# Patient Record
Sex: Male | Born: 1985 | Race: Black or African American | Hispanic: No | Marital: Single | Smoking: Never smoker
Health system: Southern US, Community
[De-identification: ages and names within clinical notes are randomized; demographics above are authoritative.]

---

## 2006-11-18 ENCOUNTER — Emergency Department (HOSPITAL_COMMUNITY): Admission: EM | Admit: 2006-11-18 | Discharge: 2006-11-18 | Payer: Self-pay | Admitting: Emergency Medicine

## 2007-10-09 ENCOUNTER — Emergency Department (HOSPITAL_COMMUNITY): Admission: EM | Admit: 2007-10-09 | Discharge: 2007-10-09 | Payer: Self-pay | Admitting: Emergency Medicine

## 2007-10-13 ENCOUNTER — Emergency Department (HOSPITAL_COMMUNITY): Admission: EM | Admit: 2007-10-13 | Discharge: 2007-10-13 | Payer: Self-pay | Admitting: Emergency Medicine

## 2009-11-29 ENCOUNTER — Emergency Department (HOSPITAL_COMMUNITY): Admission: EM | Admit: 2009-11-29 | Discharge: 2009-11-29 | Payer: Self-pay | Admitting: Emergency Medicine

## 2010-04-11 LAB — COMPREHENSIVE METABOLIC PANEL
CO2: 27 mEq/L (ref 19–32)
Calcium: 9.7 mg/dL (ref 8.4–10.5)
Creatinine, Ser: 1.14 mg/dL (ref 0.4–1.5)
GFR calc Af Amer: >60 mL/min (ref >60)
GFR calc non Af Amer: >60 mL/min (ref >60)
Glucose, Bld: 124 mg/dL — ABNORMAL HIGH (ref 70–99)

## 2010-04-11 LAB — URINALYSIS, ROUTINE W REFLEX MICROSCOPIC
Bilirubin Urine: NEGATIVE
Ketones, ur: >80 mg/dL — AB
Nitrite: NEGATIVE
pH: 6 (ref 5.0–8.0)

## 2010-04-11 LAB — DIFFERENTIAL
Lymphocytes Relative: 4 % — ABNORMAL LOW (ref 12–46)
Lymphs Abs: 0.6 10*3/uL — ABNORMAL LOW (ref 0.7–4.0)
Neutro Abs: 12.8 10*3/uL — ABNORMAL HIGH (ref 1.7–7.7)
Neutrophils Relative %: 91 % — ABNORMAL HIGH (ref 43–77)

## 2010-04-11 LAB — CBC
HCT: 48.2 % (ref 39.0–52.0)
Hemoglobin: 17.3 g/dL — ABNORMAL HIGH (ref 13.0–17.0)
MCH: 30.5 pg (ref 26.0–34.0)
MCHC: 35.9 g/dL (ref 30.0–36.0)

## 2010-05-03 ENCOUNTER — Inpatient Hospital Stay (INDEPENDENT_AMBULATORY_CARE_PROVIDER_SITE_OTHER)
Admission: RE | Admit: 2010-05-03 | Discharge: 2010-05-03 | Disposition: A | Discharge status: Home / Self Care | Payer: Self-pay | Source: Ambulatory Visit | Attending: Family Medicine | Admitting: Family Medicine

## 2010-05-03 DIAGNOSIS — R059 Cough, unspecified: Secondary | ICD-10-CM

## 2010-05-03 DIAGNOSIS — R05 Cough: Secondary | ICD-10-CM

## 2011-12-11 ENCOUNTER — Ambulatory Visit (INDEPENDENT_AMBULATORY_CARE_PROVIDER_SITE_OTHER): Payer: 59 | Admitting: Family Medicine

## 2011-12-11 VITALS — BP 128/80 | HR 58 | Temp 98.2°F | Resp 16 | Ht 67.5 in | Wt 135.8 lb

## 2011-12-11 DIAGNOSIS — S239XXA Sprain of unspecified parts of thorax, initial encounter: Secondary | ICD-10-CM

## 2011-12-11 MED ORDER — CYCLOBENZAPRINE HCL 10 MG PO TABS: 10.0000 mg | ORAL_TABLET | Freq: Every evening | ORAL | Status: DC | PRN

## 2011-12-11 MED ORDER — PREDNISONE 20 MG PO TABS: 40.0000 mg | ORAL_TABLET | Freq: Every day | ORAL | Status: DC

## 2011-12-11 NOTE — Patient Instructions (Addendum)
Thoracic Strain  You have injured the muscles or tendons that attach to the upper part of your back behind your chest. This injury is called a thoracic strain, thoracic sprain, or mid-back strain.   CAUSES   The cause of thoracic strain varies. A less severe injury involves pulling a muscle or tendon without tearing it. A more severe injury involves tearing (rupturing) a muscle or tendon. With less severe injuries, there may be little loss of strength. Sometimes, there are breaks (fractures) in the bones to which the muscles are attached. These fractures are rare, unless there was a direct hit (trauma) or you have weak bones due to osteoporosis or age. Longstanding strains may be caused by overuse or improper form during certain movements. Obesity can also increase your risk for back injuries. Sudden strains may occur due to injury or not warming up properly before exercise. Often, there is no obvious cause for a thoracic strain.  SYMPTOMS   The main symptom is pain, especially with movement, such as during exercise.  DIAGNOSIS   Your caregiver can usually tell what is wrong by taking an X-ray and doing a physical exam.  TREATMENT    Physical therapy may be helpful for recovery. Your caregiver can give you exercises to do or refer you to a physical therapist after your pain improves.   After your pain improves, strengthening and conditioning programs appropriate for your sport or occupation may be helpful.   Always warm up before physical activities or athletics. Stretching after physical activity may also help.   Certain over-the-counter medicines may also help. Ask your caregiver if there are medicines that would help you.  If this is your first thoracic strain injury, proper care and proper healing time before starting activities should prevent long-term problems. Torn ligaments and tendons require as long to heal as broken bones. Average healing times may be only 1 week for a mild strain. For torn muscles  and tendons, healing time may be up to 6 weeks to 2 months.  HOME CARE INSTRUCTIONS    Apply ice to the injured area. Ice massages may also be used as directed.   Put ice in a plastic bag.   Place a towel between your skin and the bag.   Leave the ice on for 15 to 20 minutes, 3 to 4 times a day, for the first 2 days.   Only take over-the-counter or prescription medicines for pain, discomfort, or fever as directed by your caregiver.   Keep your appointments for physical therapy if this was prescribed.   Use wraps and back braces as instructed.  SEEK IMMEDIATE MEDICAL CARE IF:    You have an increase in bruising, swelling, or pain.   Your pain has not improved with medicines.   You develop new shortness of breath, chest pain, or fever.   Problems seem to be getting worse rather than better.  MAKE SURE YOU:    Understand these instructions.   Will watch your condition.   Will get help right away if you are not doing well or get worse.  Document Released: 04/07/2003 Document Revised: 04/09/2011 Document Reviewed: 03/03/2010  ExitCare Patient Information 2013 [employer].

## 2011-12-11 NOTE — Progress Notes (Signed)
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 He says he's had this for 2 weeks during which she was in a bowling league. He's no longer bowling actively.  He's had no shortness of breath, anterior chest pain, cough, hemoptysis. He's also had no history of an acute injury.  He says the pain gets better when he moves around the bed his back tightens up when he stops moving.  Objective: No acute distress Chest is clear he is mildly tender in the rhomboid area of his right upper thoracic chest. He has full range of motion of the spine and is not particularly tender with palpation over the spinous processes. The overlying skin shows no swelling, ecchymosis, or rash. His right shoulder range of motion is normal.  Assessment: Muscle strain, right rhomboids, staying the same (not  worsening)   Plan: 1. Thoracic back sprain  cyclobenzaprine (FLEXERIL) 10 MG tablet, predniSONE (DELTASONE) 20 MG tablet   Call if not completely resolved by the end of the week or return.

## 2012-10-17 ENCOUNTER — Ambulatory Visit: Payer: 59

## 2012-10-17 ENCOUNTER — Ambulatory Visit (INDEPENDENT_AMBULATORY_CARE_PROVIDER_SITE_OTHER): Payer: 59 | Admitting: Family Medicine

## 2012-10-17 VITALS — BP 104/76 | HR 64 | Temp 98.5°F | Resp 18 | Ht 66.5 in | Wt 127.8 lb

## 2012-10-17 DIAGNOSIS — M79642 Pain in left hand: Secondary | ICD-10-CM

## 2012-10-17 DIAGNOSIS — S62102A Fracture of unspecified carpal bone, left wrist, initial encounter for closed fracture: Secondary | ICD-10-CM

## 2012-10-17 DIAGNOSIS — M79609 Pain in unspecified limb: Secondary | ICD-10-CM

## 2012-10-17 DIAGNOSIS — S62109A Fracture of unspecified carpal bone, unspecified wrist, initial encounter for closed fracture: Secondary | ICD-10-CM

## 2012-10-17 NOTE — Progress Notes (Signed)
[facility] [address], [place] [address] [phone]  Date:  10/17/2012   Name:  Dustin Saunders   DOB:  [birth year]   MRN:  [id number]  PCP:  No primary provider on file.    Chief Complaint: Hand Injury   History of Present Illness:  Dustin Saunders is a 28 y.o. very pleasant male patient who presents with the following:  Last night he was playing basketball- he fell onto his left hand, FOOSH mechanism.  He was able to continue to play, but about an hour later the hand started to hurt.   It is still sore today so he decided to come in for an evaluation  Otherwise he is unhurt, he is generally healthy.   No prior history of injury to his left hand, he is right handed    There are no active problems to display for this patient.   History reviewed. No pertinent past medical history.  History reviewed. No pertinent past surgical history.  History  Substance Use Topics  . Smoking status: Never Smoker   . Smokeless tobacco: Not on file  . Alcohol Use: No    Family History  Problem Relation Age of Onset  . Hypertension Mother   . Cancer Father     No Known Allergies  Medication list has been reviewed and updated.  Current Outpatient Prescriptions on File Prior to Visit  Medication Sig Dispense Refill  . cyclobenzaprine (FLEXERIL) 10 MG tablet Take 1 tablet (10 mg total) by mouth at bedtime as needed for muscle spasms.  15 tablet  0  . predniSONE (DELTASONE) 20 MG tablet Take 2 tablets (40 mg total) by mouth daily.  10 tablet  0   No current facility-administered medications on file prior to visit.    Review of Systems:  As per HPI- otherwise negative.   Physical Examination: Filed Vitals:   10/17/12 1154  BP: 104/76  Pulse: 64  Temp: 98.5 F (36.9 C)  Resp: 18   Filed Vitals:   10/17/12 1154  Height: 5' 6.5" (1.689 m)  Weight: 127 lb 12.8 oz (57.97 kg)   Body mass index is 20.32 kg/(m^2). Ideal Body Weight: Weight in  (lb) to have BMI = 25: 156.9  GEN: WDWN, NAD, Non-toxic, A & O x 3, looks well HEENT: Atraumatic, Normocephalic. Neck supple. No masses, No LAD. Ears and Nose: No external deformity. CV: RRR, No M/G/R. No JVD. No thrill. No extra heart sounds. PULM: CTA B, no wheezes, crackles, rhonchi. No retractions. No resp. distress. No accessory muscle use. EXTR: No c/c/e NEURO Normal gait.  PSYCH: Normally interactive. Conversant. Not depressed or anxious appearing.  Calm demeanor.  Left hand: he is somewhat tender over the proximal 4th and 5th MC and over the wrist. No pinpoint tenderness.  He has pain with wrist flexion and extension No swelling, bruising, heat or wound.   Elbow negative.  Fingers NV intact, normal pulse at wrist, normal cap refill and sensation of fingertips.    [facility] reading (PRIMARY) by  Dr. Patsy Lager. Left hand: small avulsion fracture on the dorsal wrist, from one of the carpal bones  LEFT HAND - COMPLETE 3+ VIEW  Comparison: None.  Findings:  Seen only on the lateral radiograph is a minimally displaced triquetral fracture. Expected adjacent soft tissue swelling. No additional fractures identified. No dislocation. Joint spaces are preserved. No radiopaque foreign body.  IMPRESSION: Minimally displaced triquetral fracture.  Clinically significant discrepancy from primary report,  if provided: None   Placed in volar splint  Assessment and Plan: Pain of left hand - Plan: DG Hand Complete Left  Closed fracture of carpal bone, left, initial encounter  Fracture of carpal bone.  Placed in a volar splint and instructed to keep it on 24/ 7.  Fingers are left mobile.  He will see [facility] next week- called pt and gave him these details.  He will let us know if any problems in the meantime   Signed Abbe Amsterdam, MD

## 2012-10-17 NOTE — Patient Instructions (Addendum)
I will be in touch with your x-ray over- read report.  We will plan to get you in to see orthopedics next week.  In the meantime wear your splint all the time.  Apply ice, and take ibuprofen as needed

## 2013-08-04 ENCOUNTER — Encounter: Payer: Self-pay | Admitting: Emergency Medicine

## 2013-08-04 ENCOUNTER — Ambulatory Visit (INDEPENDENT_AMBULATORY_CARE_PROVIDER_SITE_OTHER): Payer: 59 | Admitting: Emergency Medicine

## 2013-08-04 ENCOUNTER — Ambulatory Visit (INDEPENDENT_AMBULATORY_CARE_PROVIDER_SITE_OTHER): Payer: 59

## 2013-08-04 VITALS — BP 110/76 | HR 107 | Temp 98.1°F | Resp 16 | Ht 67.0 in | Wt 131.6 lb

## 2013-08-04 DIAGNOSIS — R1115 Cyclical vomiting syndrome unrelated to migraine: Secondary | ICD-10-CM

## 2013-08-04 DIAGNOSIS — R0602 Shortness of breath: Secondary | ICD-10-CM

## 2013-08-04 DIAGNOSIS — F411 Generalized anxiety disorder: Secondary | ICD-10-CM

## 2013-08-04 MED ORDER — ONDANSETRON 8 MG PO TBDP: 8.0000 mg | ORAL_TABLET | Freq: Three times a day (TID) | ORAL | Status: DC | PRN

## 2013-08-04 MED ORDER — LOPERAMIDE HCL 2 MG PO TABS: 2.0000 mg | ORAL_TABLET | Freq: Four times a day (QID) | ORAL | Status: DC | PRN

## 2013-08-04 NOTE — Patient Instructions (Signed)
Clear Liquid Diet A clear liquid diet is a short-term diet that is prescribed to provide the necessary fluid and basic energy you need when you can have nothing else. The clear liquid diet consists of liquids or solids that will become liquid at room temperature. You should be able to see through the liquid. There are many reasons that you may be restricted to clear liquids, such as:  When you have a sudden-onset (acute) condition that occurs before or after surgery.  To help your body slowly get adjusted to food again after a long period when you were unable to have food.  Replacement of fluids when you have a diarrheal disease.  When you are going to have certain exams, such as a colonoscopy, in which instruments are inserted inside your body to look at parts of your digestive system. WHAT CAN I HAVE? A clear liquid diet does not provide all the nutrients you need. It is important to choose a variety of the following items to get as many nutrients as possible:  Vegetable juices that do not have pulp.  Fruit juices and fruit drinks that do not have pulp.  Coffee (regular or decaffeinated), tea, or soda at the discretion of your health care provider.  Clear bouillon, broth, or strained broth-based soups.  High-protein and flavored gelatins.  Sugar or honey.  Ices or frozen ice pops that do not contain milk. If you are not sure whether you can have certain items, you should ask your health care provider. You may also ask your health care provider if there are any other clear liquid options. Document Released: 01/15/2005 Document Revised: 01/20/2013 Document Reviewed: 12/12/2012 ExitCare Patient Information 2015 Exit[employer]. This information is not intended to replace advice given to you by your health care provider. Make sure you discuss any questions you have with your health care provider.  

## 2013-08-04 NOTE — Progress Notes (Signed)
[facility] [address], [place] [id number] [phone]  Date:  08/04/2013   Name:  Dustin AllanJoshua W Marczak   DOB:  08/25/1985   MRN:  [id number]  PCP:  No PCP Per Patient    Chief Complaint: No chief complaint on file.   History of Present Illness:  Dustin Saunders is a 28 y.o. very pleasant male patient who presents with the following:  I reviewed the CC, ROS FH, PMH and SH and nurses note that were not available when I opened the chart in the EMR  Patient developed diarrhea this morning around 0600.  He has had several watery stools.  The patient has no complaint of blood, mucous, or pus in her stools. No fever or chills.  Was nauseated following and now has a sensation of shortness of breath.  He has no cough or wheezing.  No sputum production. No orthopnea or PND.  Drank an energy drink yesterday and again today.  Never had similar prior to yesterday.  Has no antecedent illness or injury. No limitation of activity.  No immobilization or surgery or prolonged travel.    There are no active problems to display for this patient.   No past medical history on file.  No past surgical history on file.  History  Substance Use Topics  . Smoking status: Never Smoker   . Smokeless tobacco: Not on file  . Alcohol Use: No    Family History  Problem Relation Age of Onset  . Hypertension Mother   . Cancer Father     No Known Allergies  Medication list has been reviewed and updated.  No current outpatient prescriptions on file prior to visit.   No current facility-administered medications on file prior to visit.    Review of Systems:  As per HPI, otherwise negative.    Physical Examination: There were no vitals filed for this visit. There were no vitals filed for this visit. There is no weight on file to calculate BMI. Ideal Body Weight:    GEN: WDWN, NAD, Non-toxic, A & O x 3  Speaks in full sentences.  Not cyanotic or tacypneic  HEENT:  Atraumatic, Normocephalic. Neck supple. No masses, No LAD. Ears and Nose: No external deformity. CV: RRR, No M/G/R. No JVD. No thrill. No extra heart sounds. PULM: CTA B, no wheezes, crackles, rhonchi. No retractions. No resp. distress. No accessory muscle use. ABD: S, NT, ND, +BS. No rebound. No HSM. EXTR: No c/c/e NEURO Normal gait.  PSYCH: Normally interactive. Conversant. Not depressed or anxious appearing.  Calm demeanor.    Assessment and Plan: Nausea and vomiting Anxiety reaction Shortness of breath Stop energy drinks Imodium zofran  Signed,  Phillips OdorJeffery Anderson, MD   [facility] reading (PRIMARY) by  Dr. Dareen PianoAnderson.  Negative chest .

## 2014-06-30 ENCOUNTER — Ambulatory Visit (INDEPENDENT_AMBULATORY_CARE_PROVIDER_SITE_OTHER): Payer: 59 | Admitting: Urgent Care

## 2014-06-30 VITALS — BP 124/78 | HR 70 | Temp 98.8°F | Resp 17 | Ht 68.0 in | Wt 134.6 lb

## 2014-06-30 DIAGNOSIS — J029 Acute pharyngitis, unspecified: Secondary | ICD-10-CM

## 2014-06-30 DIAGNOSIS — J040 Acute laryngitis: Secondary | ICD-10-CM

## 2014-06-30 DIAGNOSIS — R490 Dysphonia: Secondary | ICD-10-CM

## 2014-06-30 LAB — POCT RAPID STREP A (OFFICE): Rapid Strep A Screen: NEGATIVE

## 2014-06-30 MED ORDER — HYDROCODONE-HOMATROPINE 5-1.5 MG/5ML PO SYRP: 5.0000 mL | ORAL_SOLUTION | Freq: Every evening | ORAL | Status: DC | PRN

## 2014-06-30 NOTE — Progress Notes (Signed)
    MRN: [id number] DOB: 05/11/1985  Subjective:   Dustin Saunders is a 29 y.o. male presenting for chief complaint of Hoarse and Sore Throat  Reports one day history of sore throat and voice hoarseness, also has left sided lymph node pain, intermittent dry cough. Has tried Tylenol for relief but this has not helped. Denies fevers, sinus headache, sinus congestion, sinus pain, ear pain, ear drainage, tooth pain, chest pain, chest tightness, shortness of breath. Of note, patient has a 30-year-old daughter that started with similar symptoms about 3 days ago, girlfriend also had symptoms earlier in the week. Denies any other aggravating or relieving factors, no other questions or concerns.  Dustin Saunders currently has no medications in their medication list. He has No Known Allergies.  Dustin Saunders  has no past medical history on file. Also  has no past surgical history on file.  ROS As in subjective.  Objective:   Vitals: BP 124/78 mmHg  Pulse 70  Temp(Src) 98.8 F (37.1 C) (Oral)  Resp 17  Ht 5\' 8"  (1.727 m)  Wt 134 lb 9.6 oz (61.054 kg)  BMI 20.47 kg/m2  SpO2 96%  Physical Exam  Constitutional: He appears well-developed and well-nourished.  HENT:  TM's intact bilaterally, no effusions or erythema. Nasal turbinates slightly boggy. No sinus tenderness. Postnasal drip present with slight erythema but without oropharyngeal exudates or abscesses.  Eyes: Conjunctivae and EOM are normal. Right eye exhibits no discharge. Left eye exhibits no discharge. No scleral icterus.  Neck: Normal range of motion.  Cardiovascular: Normal rate, regular rhythm and intact distal pulses.  Exam reveals no gallop and no friction rub.   No murmur heard. Pulmonary/Chest: No stridor. No respiratory distress. He has no wheezes. He has no rales. He exhibits no tenderness.  Lymphadenopathy:    He has cervical adenopathy (left sided).  Skin: Skin is warm and dry. No rash noted. No erythema. No pallor.   Results for  orders placed or performed in visit on 06/30/14 (from the past 24 hour(s))  POCT rapid strep A     Status: Normal   Collection Time: 06/30/14  3:50 PM  Result Value Ref Range   Rapid Strep A Screen Negative Negative   Assessment and Plan :   1. Sore throat 2. Hoarseness of voice 3. Laryngitis 4. Pharyngitis - Likely undergoing viral syndrome, culture pending, advise Hycodan at night for cough and sore throat, Delsym during the day, may supplement this with ibuprofen - Return to clinic in one week if symptoms persist and have not improved  Wallis BambergMario Niv Darley, PA-C [facility] [phone] 06/30/2014 3:29 PM

## 2014-06-30 NOTE — Patient Instructions (Signed)
Laryngitis At the top of your windpipe is your voice box. It is the source of your voice. Inside your voice box are 2 bands of muscles called vocal cords. When you breathe, your vocal cords are relaxed and open so that air can get into the lungs. When you decide to say something, these cords come together and vibrate. The sound from these vibrations goes into your throat and comes out through your mouth as sound. Laryngitis is an inflammation of the vocal cords that causes hoarseness, cough, loss of voice, sore throat, and dry throat. Laryngitis can be temporary (acute) or long-term (chronic). Most cases of acute laryngitis improve with time.Chronic laryngitis lasts for more than 3 weeks. CAUSES Laryngitis can often be related to excessive smoking, talking, or yelling, as well as inhalation of toxic fumes and allergies. Acute laryngitis is usually caused by a viral infection, vocal strain, measles or mumps, or bacterial infections. Chronic laryngitis is usually caused by vocal cord strain, vocal cord injury, postnasal drip, growths on the vocal cords, or acid reflux. SYMPTOMS   Cough.  Sore throat.  Dry throat. RISK FACTORS  Respiratory infections.  Exposure to irritating substances, such as cigarette smoke, excessive amounts of alcohol, stomach acids, and workplace chemicals.  Voice trauma, such as vocal cord injury from shouting or speaking too loud. DIAGNOSIS  Your cargiver will perform a physical exam. During the physical exam, your caregiver will examine your throat. The most common sign of laryngitis is hoarseness. Laryngoscopy may be necessary to confirm the diagnosis of this condition. This procedure allows your caregiver to look into the larynx. HOME CARE INSTRUCTIONS  Drink enough fluids to keep your urine clear or pale yellow.  Rest until you no longer have symptoms or as directed by your caregiver.  Breathe in moist air.  Take all medicine as directed by your  caregiver.  Do not smoke.  Talk as little as possible (this includes whispering).  Write on paper instead of talking until your voice is back to normal.  Follow up with your caregiver if your condition has not improved after 10 days. SEEK MEDICAL CARE IF:   You have trouble breathing.  You cough up blood.  You have persistent fever.  You have increasing pain.  You have difficulty swallowing. MAKE SURE YOU:  Understand these instructions.  Will watch your condition.  Will get help right away if you are not doing well or get worse. Document Released: 01/15/2005 Document Revised: 04/09/2011 Document Reviewed: 03/23/2010 [facility] Patient Information 2015 [place]. This information is not intended to replace advice given to you by your health care provider. Make sure you discuss any questions you have with your health care provider.    Pharyngitis Pharyngitis is redness, pain, and swelling (inflammation) of your pharynx.  CAUSES  Pharyngitis is usually caused by infection. Most of the time, these infections are from viruses (viral) and are part of a cold. However, sometimes pharyngitis is caused by bacteria (bacterial). Pharyngitis can also be caused by allergies. Viral pharyngitis may be spread from person to person by coughing, sneezing, and personal items or utensils (cups, forks, spoons, toothbrushes). Bacterial pharyngitis may be spread from person to person by more intimate contact, such as kissing.  SIGNS AND SYMPTOMS  Symptoms of pharyngitis include:   Sore throat.   Tiredness (fatigue).   Low-grade fever.   Headache.  Joint pain and muscle aches.  Skin rashes.  Swollen lymph nodes.  Plaque-like film on throat or tonsils (often seen with  DIAGNOSIS  °Your health care provider will ask you questions about your illness and your symptoms. Your medical history, along with a physical exam, is often all that is needed to diagnose  pharyngitis. Sometimes, a rapid strep test is done. Other lab tests may also be done, depending on the suspected cause.  °TREATMENT  °Viral pharyngitis will usually get better in 3-4 days without the use of medicine. Bacterial pharyngitis is treated with medicines that kill germs (antibiotics).  °HOME CARE INSTRUCTIONS  °· Drink enough water and fluids to keep your urine clear or pale yellow.   °· Only take over-the-counter or prescription medicines as directed by your health care provider:   °¨ If you are prescribed antibiotics, make sure you finish them even if you start to feel better.   °¨ Do not take aspirin.   °· Get lots of rest.   °· Gargle with 8 oz of salt water (½ tsp of salt per 1 qt of water) as often as every 1-2 hours to soothe your throat.   °· Throat lozenges (if you are not at risk for choking) or sprays may be used to soothe your throat. °SEEK MEDICAL CARE IF:  °· You have large, tender lumps in your neck. °· You have a rash. °· You cough up green, yellow-brown, or bloody spit. °SEEK IMMEDIATE MEDICAL CARE IF:  °· Your neck becomes stiff. °· You drool or are unable to swallow liquids. °· You vomit or are unable to keep medicines or liquids down. °· You have severe pain that does not go away with the use of recommended medicines. °· You have trouble breathing (not caused by a stuffy nose). °MAKE SURE YOU:  °· Understand these instructions. °· Will watch your condition. °· Will get help right away if you are not doing well or get worse. °Document Released: 01/15/2005 Document Revised: 11/05/2012 Document Reviewed: 09/22/2012 °ExitCare® Patient Information ©2015 [employer], LLC. This information is not intended to replace advice given to you by your health care provider. Make sure you discuss any questions you have with your health care provider. ° °

## 2014-07-02 LAB — CULTURE, GROUP A STREP: Organism ID, Bacteria: NORMAL

## 2014-07-09 ENCOUNTER — Telehealth: Payer: Self-pay | Admitting: Urgent Care

## 2014-07-09 NOTE — Telephone Encounter (Signed)
-----   Message from Johnnette Litter, [place] sent at 07/07/2014  9:29 AM EDT ----- Spoke with pt. Notified of labs. Pt states he is overall feeling ok, but still has a bad cough. Cough syrup helps somewhat at night, but wants to know if he can get something to take during the day. Thanks

## 2014-07-09 NOTE — Telephone Encounter (Signed)
Called patient to follow up and left voice message. I apologized for the delay in getting back to him. I also offered antibiotic course if he is still symptomatic/experiencing laryngitis. Please let me know what he would like to do. Thank you!

## 2015-06-30 IMAGING — CR DG CHEST 2V
2 series · 2 of 2 positions shown · non-contrast
Comparison: None.

CLINICAL DATA: Difficulty breathing

EXAM:
CHEST  2 VIEW

[PA]
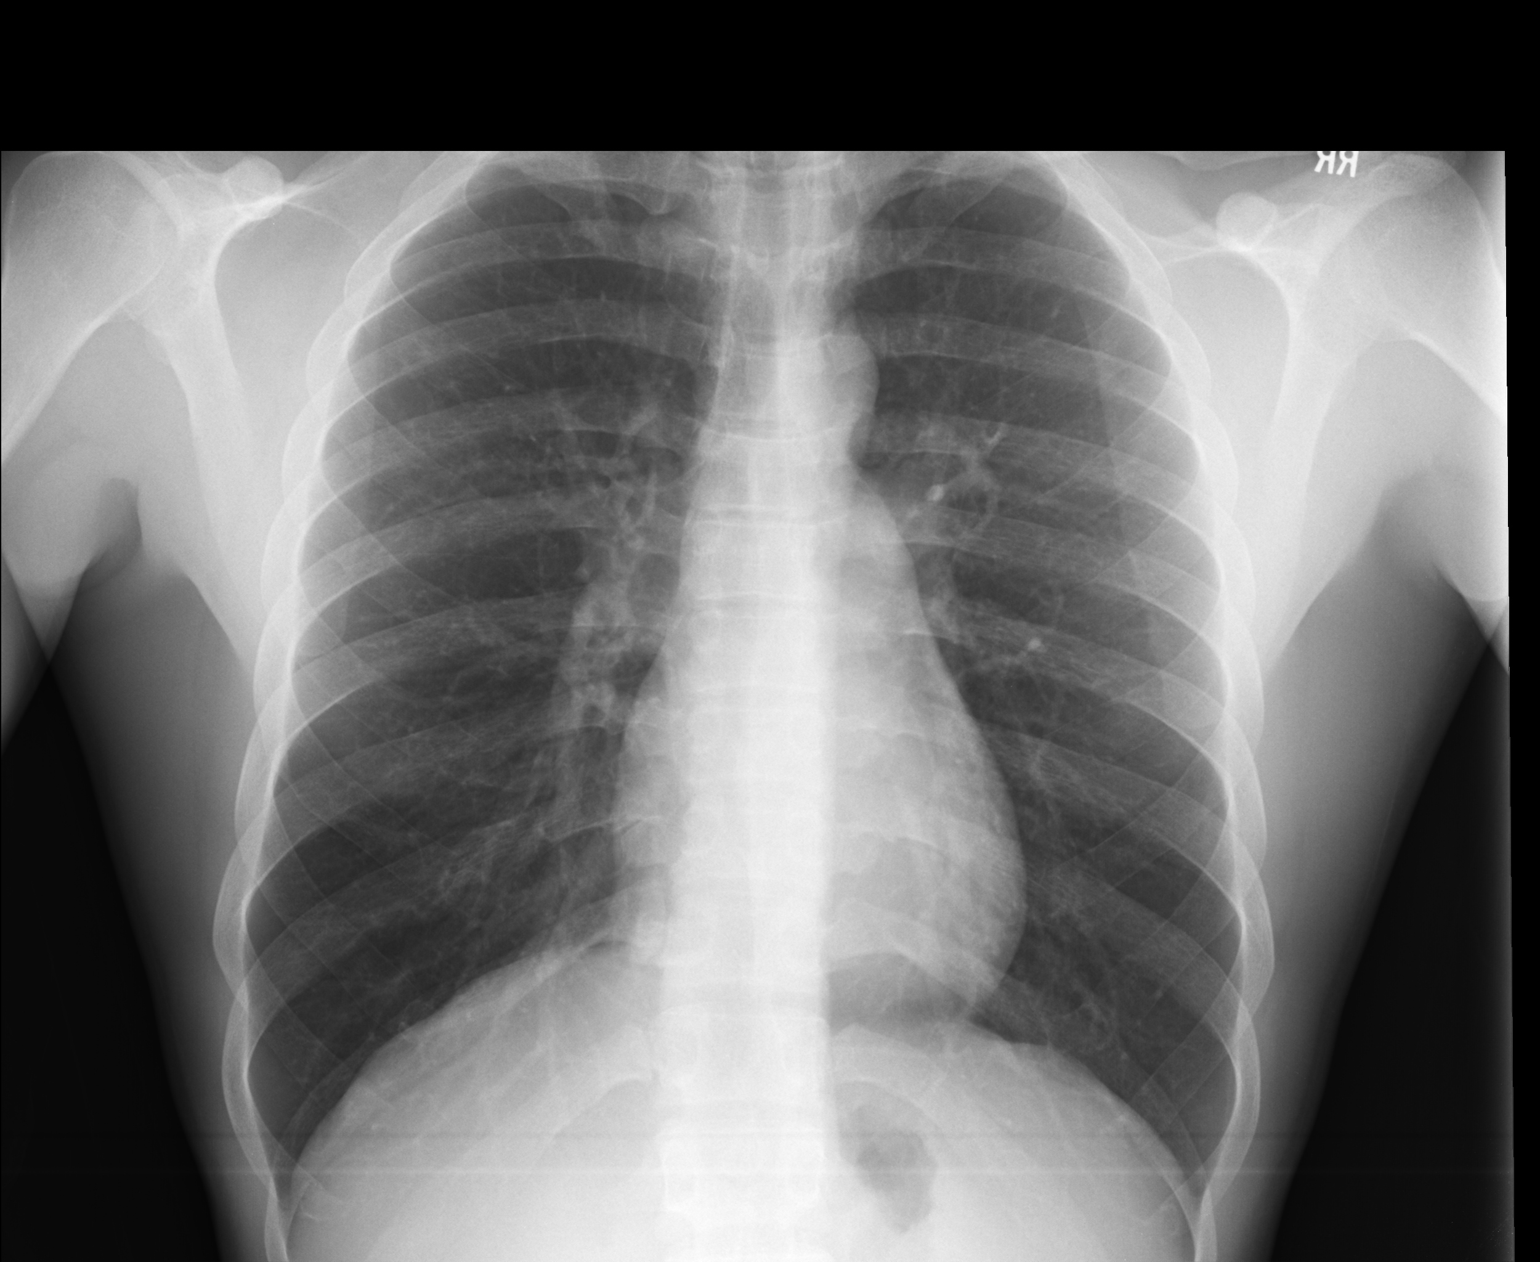

[lateral]
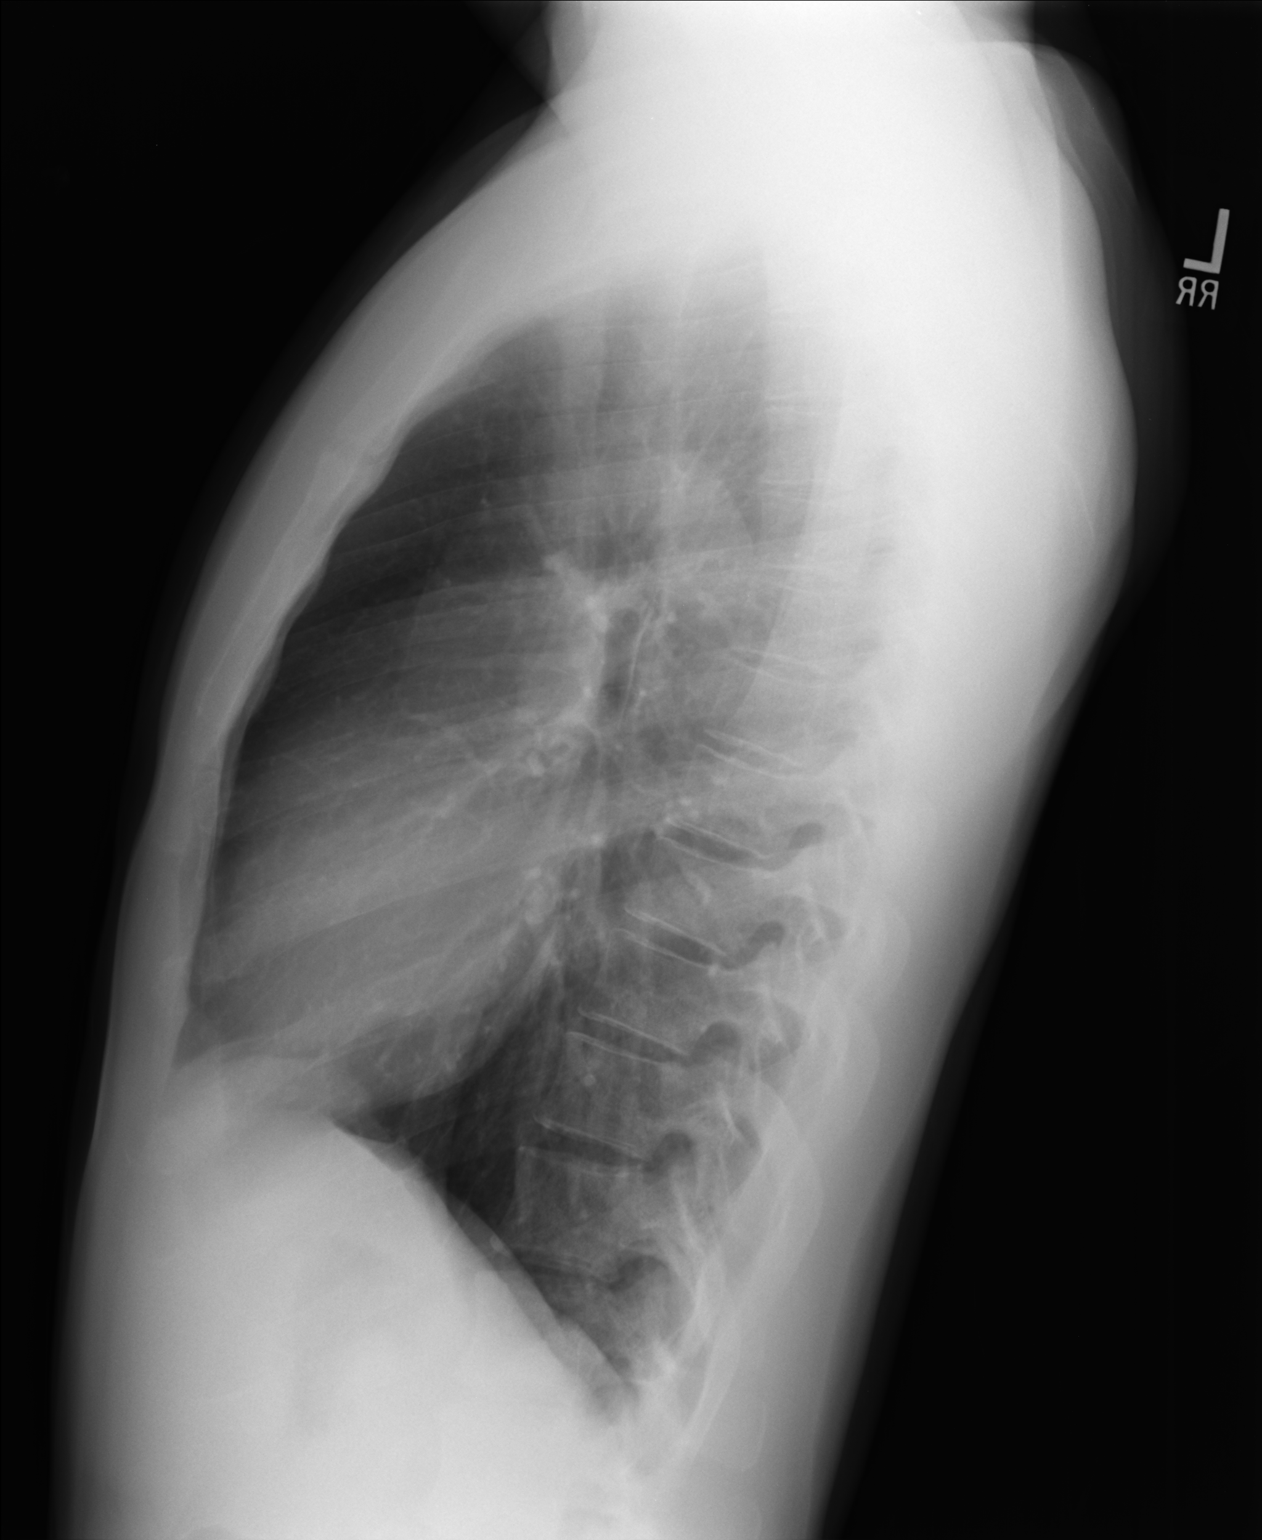

[2 of 2 positions shown; findings below may reference images not displayed]

FINDINGS: Lungs are clear. Heart size and pulmonary vascularity are normal. No
adenopathy. No bone lesions. There is thoracic lordosis.
IMPRESSION: No edema or consolidation.

## 2015-07-29 ENCOUNTER — Ambulatory Visit (INDEPENDENT_AMBULATORY_CARE_PROVIDER_SITE_OTHER): Payer: 59 | Admitting: Emergency Medicine

## 2015-07-29 VITALS — BP 116/80 | HR 65 | Temp 98.3°F | Resp 18 | Ht 68.0 in | Wt 138.0 lb

## 2015-07-29 DIAGNOSIS — Z131 Encounter for screening for diabetes mellitus: Secondary | ICD-10-CM | POA: Diagnosis not present

## 2015-07-29 DIAGNOSIS — R202 Paresthesia of skin: Secondary | ICD-10-CM | POA: Diagnosis not present

## 2015-07-29 DIAGNOSIS — R200 Anesthesia of skin: Secondary | ICD-10-CM

## 2015-07-29 DIAGNOSIS — Z0000 Encounter for general adult medical examination without abnormal findings: Secondary | ICD-10-CM | POA: Diagnosis not present

## 2015-07-29 DIAGNOSIS — Z13220 Encounter for screening for lipoid disorders: Secondary | ICD-10-CM

## 2015-07-29 DIAGNOSIS — R208 Other disturbances of skin sensation: Secondary | ICD-10-CM

## 2015-07-29 LAB — POCT CBC
GRANULOCYTE PERCENT: 62.9 % (ref 37–80)
HEMATOCRIT: 45.6 % (ref 43.5–53.7)
HEMOGLOBIN: 16.4 g/dL (ref 14.1–18.1)
Lymph, poc: 2.6 (ref 0.6–3.4)
MCH, POC: 30.1 pg (ref 27–31.2)
MCHC: 35.9 g/dL — AB (ref 31.8–35.4)
MCV: 83.9 fL (ref 80–97)
MID (cbc): 0.3 (ref 0–0.9)
MPV: 7.2 fL (ref 0–99.8)
POC GRANULOCYTE: 4.8 (ref 2–6.9)
POC LYMPH PERCENT: 33.2 %L (ref 10–50)
POC MID %: 3.9 %M (ref 0–12)
Platelet Count, POC: 227 10*3/uL (ref 142–424)
RBC: 5.43 M/uL (ref 4.69–6.13)
RDW, POC: 13.2 %
WBC: 7.7 10*3/uL (ref 4.6–10.2)

## 2015-07-29 LAB — POCT URINALYSIS DIP (MANUAL ENTRY)
BILIRUBIN UA: NEGATIVE
GLUCOSE UA: NEGATIVE
Ketones, POC UA: NEGATIVE
Leukocytes, UA: NEGATIVE
NITRITE UA: NEGATIVE
Protein Ur, POC: NEGATIVE
RBC UA: NEGATIVE
Spec Grav, UA: 1.02
Urobilinogen, UA: 0.2
pH, UA: 7.5

## 2015-07-29 LAB — COMPLETE METABOLIC PANEL WITH GFR
ALT: 10 U/L (ref 9–46)
AST: 13 U/L (ref 10–40)
Albumin: 4.8 g/dL (ref 3.6–5.1)
Alkaline Phosphatase: 40 U/L (ref 40–115)
BUN: 15 mg/dL (ref 7–25)
CALCIUM: 10 mg/dL (ref 8.6–10.3)
CHLORIDE: 100 mmol/L (ref 98–110)
CO2: 28 mmol/L (ref 20–31)
Creat: 1.08 mg/dL (ref 0.60–1.35)
GFR, Est Non African American: >89 mL/min (ref >=60)
Glucose, Bld: 81 mg/dL (ref 65–99)
POTASSIUM: 4.1 mmol/L (ref 3.5–5.3)
Sodium: 138 mmol/L (ref 135–146)
Total Bilirubin: 0.7 mg/dL (ref 0.2–1.2)
Total Protein: 8.1 g/dL (ref 6.1–8.1)

## 2015-07-29 LAB — LIPID PANEL
CHOL/HDL RATIO: 2.3 ratio (ref <=5.0)
CHOLESTEROL: 190 mg/dL (ref 125–200)
HDL: 81 mg/dL (ref >=40)
LDL Cholesterol: 88 mg/dL (ref <130)
TRIGLYCERIDES: 107 mg/dL (ref <150)
VLDL: 21 mg/dL (ref <30)

## 2015-07-29 LAB — POCT GLYCOSYLATED HEMOGLOBIN (HGB A1C): Hemoglobin A1C: 5.2

## 2015-07-29 LAB — TSH: TSH: 1.23 mIU/L (ref 0.40–4.50)

## 2015-07-29 LAB — GLUCOSE, POCT (MANUAL RESULT ENTRY): POC GLUCOSE: 81 mg/dL (ref 70–99)

## 2015-07-29 NOTE — Patient Instructions (Addendum)
   IF you received an x-ray today, you will receive an invoice from [facility]. Please contact  Radiology at [phone] with questions or concerns regarding your invoice.   IF you received labwork today, you will receive an invoice from [facility]. Please contact [facility] at [phone] with questions or concerns regarding your invoice.   Our billing staff will not be able to assist you with questions regarding bills from these companies.  You will be contacted with the lab results as soon as they are available. The fastest way to get your results is to activate your My Chart account. Instructions are located on the last page of this paperwork. If you have not heard from us regarding the results in 2 weeks, please contact this office.    Health Maintenance, Male A healthy lifestyle and preventative care can promote health and wellness.  Maintain regular health, dental, and eye exams.  Eat a healthy diet. Foods like vegetables, fruits, whole grains, low-fat dairy products, and lean protein foods contain the nutrients you need and are low in calories. Decrease your intake of foods high in solid fats, added sugars, and salt. Get information about a proper diet from your health care provider, if necessary.  Regular physical exercise is one of the most important things you can do for your health. Most adults should get at least 150 minutes of moderate-intensity exercise (any activity that increases your heart rate and causes you to sweat) each week. In addition, most adults need muscle-strengthening exercises on 2 or more days a week.   Maintain a healthy weight. The body mass index (BMI) is a screening tool to identify possible weight problems. It provides an estimate of body fat based on height and weight. Your health care provider can find your BMI and can help you achieve or maintain a healthy weight. For males 20 years and older:  A BMI  below 18.5 is considered underweight.  A BMI of 18.5 to 24.9 is normal.  A BMI of 25 to 29.9 is considered overweight.  A BMI of 30 and above is considered obese.  Maintain normal blood lipids and cholesterol by exercising and minimizing your intake of saturated fat. Eat a balanced diet with plenty of fruits and vegetables. Blood tests for lipids and cholesterol should begin at age 20 and be repeated every 5 years. If your lipid or cholesterol levels are high, you are over age 50, or you are at high risk for heart disease, you may need your cholesterol levels checked more frequently.Ongoing high lipid and cholesterol levels should be treated with medicines if diet and exercise are not working.  If you smoke, find out from your health care provider how to quit. If you do not use tobacco, do not start.  Lung cancer screening is recommended for adults aged 55-80 years who are at high risk for developing lung cancer because of a history of smoking. A yearly low-dose CT scan of the lungs is recommended for people who have at least a 30-pack-year history of smoking and are current smokers or have quit within the past 15 years. A pack year of smoking is smoking an average of 1 pack of cigarettes a day for 1 year (for example, a 30-pack-year history of smoking could mean smoking 1 pack a day for 30 years or 2 packs a day for 15 years). Yearly screening should continue until the smoker has stopped smoking for at least 15 years. Yearly screening should be stopped   for people who develop a health problem that would prevent them from having lung cancer treatment.  If you choose to drink alcohol, do not have more than 2 drinks per day. One drink is considered to be 12 oz (360 mL) of beer, 5 oz (150 mL) of wine, or 1.5 oz (45 mL) of liquor.  Avoid the use of street drugs. Do not share needles with anyone. Ask for help if you need support or instructions about stopping the use of drugs.  High blood pressure  causes heart disease and increases the risk of stroke. High blood pressure is more likely to develop in:  People who have blood pressure in the end of the normal range (100-139/85-89 mm Hg).  People who are overweight or obese.  People who are African American.  If you are 18-39 years of age, have your blood pressure checked every 3-5 years. If you are 40 years of age or older, have your blood pressure checked every year. You should have your blood pressure measured twice--once when you are at a hospital or clinic, and once when you are not at a hospital or clinic. Record the average of the two measurements. To check your blood pressure when you are not at a hospital or clinic, you can use:  An automated blood pressure machine at a pharmacy.  A home blood pressure monitor.  If you are 45-79 years old, ask your health care provider if you should take aspirin to prevent heart disease.  Diabetes screening involves taking a blood sample to check your fasting blood sugar level. This should be done once every 3 years after age 45 if you are at a normal weight and without risk factors for diabetes. Testing should be considered at a younger age or be carried out more frequently if you are overweight and have at least 1 risk factor for diabetes.  Colorectal cancer can be detected and often prevented. Most routine colorectal cancer screening begins at the age of 50 and continues through age 75. However, your health care provider may recommend screening at an earlier age if you have risk factors for colon cancer. On a yearly basis, your health care provider may provide home test kits to check for hidden blood in the stool. A small camera at the end of a tube may be used to directly examine the colon (sigmoidoscopy or colonoscopy) to detect the earliest forms of colorectal cancer. Talk to your health care provider about this at age 50 when routine screening begins. A direct exam of the colon should be repeated  every 5-10 years through age 75, unless early forms of precancerous polyps or small growths are found.  People who are at an increased risk for hepatitis B should be screened for this virus. You are considered at high risk for hepatitis B if:  You were born in a country where hepatitis B occurs often. Talk with your health care provider about which countries are considered high risk.  Your parents were born in a high-risk country and you have not received a shot to protect against hepatitis B (hepatitis B vaccine).  You have HIV or AIDS.  You use needles to inject street drugs.  You live with, or have sex with, someone who has hepatitis B.  You are a man who has sex with other men (MSM).  You get hemodialysis treatment.  You take certain medicines for conditions like cancer, organ transplantation, and autoimmune conditions.  Hepatitis C blood testing is recommended for   all people born from 1945 through 1965 and any individual with known risk factors for hepatitis C.  Healthy men should no longer receive prostate-specific antigen (PSA) blood tests as part of routine cancer screening. Talk to your health care provider about prostate cancer screening.  Testicular cancer screening is not recommended for adolescents or adult males who have no symptoms. Screening includes self-exam, a health care provider exam, and other screening tests. Consult with your health care provider about any symptoms you have or any concerns you have about testicular cancer.  Practice safe sex. Use condoms and avoid high-risk sexual practices to reduce the spread of sexually transmitted infections (STIs).  You should be screened for STIs, including gonorrhea and chlamydia if:  You are sexually active and are younger than 24 years.  You are older than 24 years, and your health care provider tells you that you are at risk for this type of infection.  Your sexual activity has changed since you were last screened,  and you are at an increased risk for chlamydia or gonorrhea. Ask your health care provider if you are at risk.  If you are at risk of being infected with HIV, it is recommended that you take a prescription medicine daily to prevent HIV infection. This is called pre-exposure prophylaxis (PrEP). You are considered at risk if:  You are a man who has sex with other men (MSM).  You are a heterosexual man who is sexually active with multiple partners.  You take drugs by injection.  You are sexually active with a partner who has HIV.  Talk with your health care provider about whether you are at high risk of being infected with HIV. If you choose to begin PrEP, you should first be tested for HIV. You should then be tested every 3 months for as long as you are taking PrEP.  Use sunscreen. Apply sunscreen liberally and repeatedly throughout the day. You should seek shade when your shadow is shorter than you. Protect yourself by wearing long sleeves, pants, a wide-brimmed hat, and sunglasses year round whenever you are outdoors.  Tell your health care provider of new moles or changes in moles, especially if there is a change in shape or color. Also, tell your health care provider if a mole is larger than the size of a pencil eraser.  A one-time screening for abdominal aortic aneurysm (AAA) and surgical repair of large AAAs by ultrasound is recommended for men aged 65-75 years who are current or former smokers.  Stay current with your vaccines (immunizations).   This information is not intended to replace advice given to you by your health care provider. Make sure you discuss any questions you have with your health care provider.   Document Released: 07/14/2007 Document Revised: 02/05/2014 Document Reviewed: 06/12/2010 Elsevier Interactive Patient Education 2016 Elsevier Inc.  

## 2015-07-29 NOTE — Progress Notes (Addendum)
By signing my name below, I, Mesha Guinyard, attest that this documentation has been prepared under the direction and in the presence of Lesle ChrisSteven Ricka Westra, MD.  Electronically Signed: Arvilla MarketMesha Guinyard, Medical Scribe. 07/29/2015. 9:28 AM.  Chief Complaint:  Chief Complaint  Patient presents with  . Annual Exam    HPI: Dustin Saunders is a 30 y.o. male who [facility] for [facility] complete physical to make sure everything is okay. Pt is concerned about his blood sugar due to FHx of DM. Pt had nausea for 2 days and was relieved when he ate. Pt had numbness in his hands and feet when it's propped up. Pt would play 3 times a week when he did play basketball. Pt recently stopped playing basketball in the past year. Pt occasionally plays basketball with his son. Pt states he's been gaining weight recently. 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 Pt had a slight discomfort in the right flank area last week- this has resolved since then. Pt has been with the same partner for 5 years and is not worried about STD exposure. Pt drinks once every 2 weeks. Pt denies smoking. Pt has FHx HLD. Pt is fasting.  No past medical history on file. No past surgical history on file. Social History   Social History  . Marital Status: Single    Spouse Name: N/A  . Number of Children: N/A  . Years of Education: N/A   Social History Main Topics  . Smoking status: Never Smoker   . Smokeless tobacco: None  . Alcohol Use: No  . Drug Use: No  . Sexual Activity: Yes   Other Topics Concern  . None   Social History Narrative   Family History  Problem Relation Age of Onset  . Hypertension Mother   . Cancer Father    No Known Allergies Prior to Admission medications   Medication Sig Start Date End Date Taking? Authorizing Provider  HYDROcodone-homatropine (HYCODAN) 5-1.5 MG/5ML syrup Take 5 mLs by mouth at bedtime as needed. Patient not taking: Reported on 07/29/2015 06/30/14   Wallis BambergMario Mani, PA-C      ROS: The patient denies fevers, chills, night sweats, unintentional weight loss, chest pain, palpitations, wheezing, dyspnea on exertion, vomiting, abdominal pain, dysuria, hematuria, melena, weakness, or tingling. + numbness + nausea  All other systems have been reviewed and were otherwise negative with the exception of those mentioned in the HPI and as above.    PHYSICAL EXAM: Filed Vitals:   07/29/15 0843  BP: 116/80  Pulse: 65  Temp: 98.3 F (36.8 C)  Resp: 18   Body mass index is 20.99 kg/(m^2).   General: Alert, no acute distress HEENT:  Normocephalic, atraumatic, oropharynx patent. [facility] Cardiovascular:  Regular rate and rhythm, no rubs murmurs or gallops.  No Carotid bruits, radial pulse intact. No pedal edema.  Respiratory: Clear to auscultation bilaterally.  No wheezes, rales, or rhonchi.  No cyanosis, no use of accessory musculature Abdominal: No organomegaly, abdomen is soft and non-tender, positive bowel sounds.  No masses. Musculoskeletal: Gait intact. No edema, tenderness Skin: No rashes. Neurologic: Facial musculature symmetric. Psychiatric: Patient acts appropriately throughout our interaction. Lymphatic: No cervical or submandibular lymphadenopathy GU: Prostate is nl  LABS: Results for orders placed or performed in visit on 07/29/15  POCT CBC  Result Value Ref Range   WBC 7.7 4.6 - 10.2 K/uL   Lymph, poc 2.6 0.6 - 3.4   POC LYMPH PERCENT 33.2  10 - 50 %L   MID (cbc) 0.3 0 - 0.9   POC MID % 3.9 0 - 12 %M   POC Granulocyte 4.8 2 - 6.9   Granulocyte percent 62.9 37 - 80 %G   RBC 5.43 4.69 - 6.13 M/uL   Hemoglobin 16.4 14.1 - 18.1 g/dL   HCT, POC 16.145.6 09.643.5 - 53.7 %   MCV 83.9 80 - 97 fL   MCH, POC 30.1 27 - 31.2 pg   MCHC 35.9 (A) 31.8 - 35.4 g/dL   RDW, POC 04.513.2 %   Platelet Count, POC 227 142 - 424 K/uL   MPV 7.2 0 - 99.8 fL  POCT glucose (manual entry)  Result Value Ref Range   POC Glucose 81 70 - 99 mg/dl  POCT glycosylated  hemoglobin (Hb A1C)  Result Value Ref Range   Hemoglobin A1C 5.2   POCT urinalysis dipstick  Result Value Ref Range   Color, UA yellow yellow   Clarity, UA clear clear   Glucose, UA negative negative   Bilirubin, UA negative negative   Ketones, POC UA negative negative   Spec Grav, UA 1.020    Blood, UA negative negative   pH, UA 7.5    Protein Ur, POC negative negative   Urobilinogen, UA 0.2    Nitrite, UA Negative Negative   Leukocytes, UA Negative Negative   EKG/XRAY:   Primary read [facility]   ASSESSMENT/PLAN: Patient has a normal physical exam. Certainly no evidence of diabetes. He has a healthy lifestyle. Advised him to increase his exercise.I personally performed the services described in this documentation, which was scribed in my presence. The recorded information has been reviewed and is accurate. Patient will check his records and see when his last shot wasInitial labs all look normal. No clear etiology to patient's tingling numbness in his fingers and toes. This does appear to be somewhat positional. Referral made to neurology for their opinion.Michaell Cowing.   Gross sideeffects, risk and benefits, and alternatives of medications d/w patient. Patient is aware that all medications have potential sideeffects and we are unable to predict every sideeffect or drug-drug interaction that may occur.  Lesle ChrisSteven Adara Kittle MD 07/29/2015 9:28 AM

## 2016-03-16 DIAGNOSIS — J189 Pneumonia, unspecified organism: Secondary | ICD-10-CM | POA: Diagnosis not present

## 2016-03-16 DIAGNOSIS — R0600 Dyspnea, unspecified: Secondary | ICD-10-CM | POA: Diagnosis not present

## 2016-06-07 ENCOUNTER — Ambulatory Visit (INDEPENDENT_AMBULATORY_CARE_PROVIDER_SITE_OTHER): Payer: 59

## 2016-06-07 ENCOUNTER — Ambulatory Visit (INDEPENDENT_AMBULATORY_CARE_PROVIDER_SITE_OTHER): Payer: 59 | Admitting: Emergency Medicine

## 2016-06-07 ENCOUNTER — Encounter: Payer: Self-pay | Admitting: Emergency Medicine

## 2016-06-07 VITALS — BP 114/69 | HR 67 | Temp 98.6°F | Resp 18 | Ht 68.0 in | Wt 141.2 lb

## 2016-06-07 DIAGNOSIS — M549 Dorsalgia, unspecified: Secondary | ICD-10-CM

## 2016-06-07 MED ORDER — DICLOFENAC SODIUM 75 MG PO TBEC: 75.0000 mg | DELAYED_RELEASE_TABLET | Freq: Two times a day (BID) | ORAL | 0 refills | Status: AC

## 2016-06-07 MED ORDER — CYCLOBENZAPRINE HCL 10 MG PO TABS: 10.0000 mg | ORAL_TABLET | Freq: Three times a day (TID) | ORAL | 0 refills | Status: DC | PRN

## 2016-06-07 NOTE — Patient Instructions (Addendum)
     IF you received an x-ray today, you will receive an invoice from [facility]. Please contact [facility] at [phone] with questions or concerns regarding your invoice.   IF you received labwork today, you will receive an invoice from [facility]. Please contact [facility] at [phone] with questions or concerns regarding your invoice.   Our billing staff will not be able to assist you with questions regarding bills from these companies.  You will be contacted with the lab results as soon as they are available. The fastest way to get your results is to activate your My Chart account. Instructions are located on the last page of this paperwork. If you have not heard from us regarding the results in 2 weeks, please contact this office.      Back Pain, Adult Back pain is very common. The pain often gets better over time. The cause of back pain is usually not dangerous. Most people can learn to manage their back pain on their own. Follow these instructions at home: Watch your back pain for any changes. The following actions may help to lessen any pain you are feeling:  Stay active. Start with short walks on flat ground if you can. Try to walk farther each day.  Exercise regularly as told by your doctor. Exercise helps your back heal faster. It also helps avoid future injury by keeping your muscles strong and flexible.  Do not sit, drive, or stand in one place for more than 30 minutes.  Do not stay in bed. Resting more than 1-2 days can slow down your recovery.  Be careful when you bend or lift an object. Use good form when lifting:  Bend at your knees.  Keep the object close to your body.  Do not twist.  Sleep on a firm mattress. Lie on your side, and bend your knees. If you lie on your back, put a pillow under your knees.  Take medicines only as told by your doctor.  Put ice on the injured area.  Put ice in a plastic bag.  Place a towel between your  skin and the bag.  Leave the ice on for 20 minutes, 2-3 times a day for the first 2-3 days. After that, you can switch between ice and heat packs.  Avoid feeling anxious or stressed. Find good ways to deal with stress, such as exercise.  Maintain a healthy weight. Extra weight puts stress on your back. Contact a doctor if:  You have pain that does not go away with rest or medicine.  You have worsening pain that goes down into your legs or buttocks.  You have pain that does not get better in one week.  You have pain at night.  You lose weight.  You have a fever or chills. Get help right away if:  You cannot control when you poop (bowel movement) or pee (urinate).  Your arms or legs feel weak.  Your arms or legs lose feeling (numbness).  You feel sick to your stomach (nauseous) or throw up (vomit).  You have belly (abdominal) pain.  You feel like you may pass out (faint). This information is not intended to replace advice given to you by your health care provider. Make sure you discuss any questions you have with your health care provider. Document Released: 07/04/2007 Document Revised: 06/23/2015 Document Reviewed: 05/19/2013 Elsevier Interactive Patient Education  2017 Elsevier Inc.  

## 2016-06-07 NOTE — Progress Notes (Signed)
Dustin Saunders 31 y.o.   Chief Complaint  Patient presents with  . Back Pain    all over back area x 1week     HISTORY OF PRESENT ILLNESS: This is a 31 y.o. male complaining of burning pain to upper-mid back x 1 week; physically active but does not recall any specific triggering event; denies neuro symptoms or leg pains; denies abdominal symptoms  or changes in urination or defecation.  Back Pain  This is a new problem. The current episode started in the past 7 days. The problem occurs constantly. The problem has been waxing and waning since onset. The pain is present in the thoracic spine and lumbar spine. The quality of the pain is described as burning. The pain does not radiate. The pain is at a severity of 5/10. The pain is moderate. The pain is the same all the time. The symptoms are aggravated by bending, position and twisting. Pertinent negatives include no abdominal pain, bladder incontinence, bowel incontinence, chest pain, dysuria, fever, headaches, leg pain, numbness, paresis, paresthesias, pelvic pain, perianal numbness, tingling, weakness or weight loss. Risk factors: none.     Prior to Admission medications   Medication Sig Start Date End Date Taking? Authorizing Provider  acetaminophen (TYLENOL) 500 MG tablet Take 500 mg by mouth every 5 (five) hours as needed.   Yes [provider]  amoxicillin (AMOXIL) 250 MG capsule Take 250 mg by mouth 3 (three) times daily.   Yes [provider]  ibuprofen (ADVIL,MOTRIN) 200 MG tablet Take 200 mg by mouth every 5 (five) hours as needed.   Yes [provider]    No Known Allergies  There are no active problems to display for this patient.   No past medical history on file.  No past surgical history on file.  Social History   Social History  . Marital status: Single    Spouse name: N/A  . Number of children: N/A  . Years of education: N/A   Occupational History  . Not on file.   Social  History Main Topics  . Smoking status: Never Smoker  . Smokeless tobacco: Never Used  . Alcohol use No  . Drug use: No  . Sexual activity: Yes   Other Topics Concern  . Not on file   Social History Narrative  . No narrative on file    Family History  Problem Relation Age of Onset  . Hypertension Mother   . Cancer Father      Review of Systems  Constitutional: Negative.  Negative for fever and weight loss.  HENT: Negative.   Eyes: Negative for blurred vision, double vision, discharge and redness.  Respiratory: Negative for cough, hemoptysis and shortness of breath.   Cardiovascular: Negative for chest pain, palpitations and leg swelling.  Gastrointestinal: Negative for abdominal pain, bowel incontinence, constipation, diarrhea, nausea and vomiting.  Genitourinary: Negative for bladder incontinence, dysuria, hematuria, pelvic pain and urgency.  Musculoskeletal: Positive for back pain. Negative for myalgias and neck pain.  Skin: Negative.  Negative for rash.  Neurological: Negative for dizziness, tingling, sensory change, focal weakness, weakness, numbness, headaches and paresthesias.  All other systems reviewed and are negative.  Vitals:   06/07/16 1407  BP: 114/69  Pulse: 67  Resp: 18  Temp: 98.6 F (37 C)     Physical Exam  Constitutional: He is oriented to person, place, and time. He appears well-developed and well-nourished.  HENT:  Head: Normocephalic and atraumatic.  Nose: Nose normal.  Mouth/Throat: Oropharynx is clear and moist.  Eyes: Conjunctivae and EOM are normal. Pupils are equal, round, and reactive to light.  Neck: Normal range of motion. Neck supple. No JVD present. No thyromegaly present.  Cardiovascular: Normal rate, regular rhythm, normal heart sounds and intact distal pulses.   Pulmonary/Chest: Effort normal and breath sounds normal.  Abdominal: Soft. Bowel sounds are normal. He exhibits no distension. There is no tenderness.  Musculoskeletal:  Normal range of motion.       Cervical back: Normal.       Thoracic back: Normal. He exhibits normal range of motion, no tenderness and no bony tenderness.       Lumbar back: Normal. He exhibits normal range of motion, no tenderness and no bony tenderness.  Extremities: NVI with FROM; good distal pulses; good muscle tone  Lymphadenopathy:    He has no cervical adenopathy.  Neurological: He is alert and oriented to person, place, and time. He displays normal reflexes. No sensory deficit. He exhibits normal muscle tone. Coordination normal.  Skin: Skin is warm and dry. Capillary refill takes less than 2 seconds.  Psychiatric: He has a normal mood and affect. His behavior is normal.  Vitals reviewed.    ASSESSMENT & PLAN: Dustin Saunders was seen today for back pain.  Diagnoses and all orders for this visit:  Acute bilateral back pain, unspecified back location -     DG Chest 2 View; Future  Musculoskeletal back pain  Other orders -     diclofenac (VOLTAREN) 75 MG EC tablet; Take 1 tablet (75 mg total) by mouth 2 (two) times daily. -     cyclobenzaprine (FLEXERIL) 10 MG tablet; Take 1 tablet (10 mg total) by mouth 3 (three) times daily as needed for muscle spasms.    Patient Instructions       IF you received an x-ray today, you will receive an invoice from [facility]. Please contact [facility] at [phone] with questions or concerns regarding your invoice.   IF you received labwork today, you will receive an invoice from [facility]. Please contact [facility] at [phone] with questions or concerns regarding your invoice.   Our billing staff will not be able to assist you with questions regarding bills from these companies.  You will be contacted with the lab results as soon as they are available. The fastest way to get your results is to activate your My Chart account. Instructions are located on the last page of this paperwork. If you have not heard from us  regarding the results in 2 weeks, please contact this office.      Back Pain, Adult Back pain is very common. The pain often gets better over time. The cause of back pain is usually not dangerous. Most people can learn to manage their back pain on their own. Follow these instructions at home: Watch your back pain for any changes. The following actions may help to lessen any pain you are feeling:  Stay active. Start with short walks on flat ground if you can. Try to walk farther each day.  Exercise regularly as told by your doctor. Exercise helps your back heal faster. It also helps avoid future injury by keeping your muscles strong and flexible.  Do not sit, drive, or stand in one place for more than 30 minutes.  Do not stay in bed. Resting more than 1-2 days can slow down your recovery.  Be careful when you bend or lift an object. Use good form when lifting:  Bend at your knees.  Keep the object close to your body.  Do not twist.  Sleep on a firm mattress. Lie on your side, and bend your knees. If you lie on your back, put a pillow under your knees.  Take medicines only as told by your doctor.  Put ice on the injured area.  Put ice in a plastic bag.  Place a towel between your skin and the bag.  Leave the ice on for 20 minutes, 2-3 times a day for the first 2-3 days. After that, you can switch between ice and heat packs.  Avoid feeling anxious or stressed. Find good ways to deal with stress, such as exercise.  Maintain a healthy weight. Extra weight puts stress on your back. Contact a doctor if:  You have pain that does not go away with rest or medicine.  You have worsening pain that goes down into your legs or buttocks.  You have pain that does not get better in one week.  You have pain at night.  You lose weight.  You have a fever or chills. Get help right away if:  You cannot control when you poop (bowel movement) or pee (urinate).  Your arms or legs feel  weak.  Your arms or legs lose feeling (numbness).  You feel sick to your stomach (nauseous) or throw up (vomit).  You have belly (abdominal) pain.  You feel like you may pass out (faint). This information is not intended to replace advice given to you by your health care provider. Make sure you discuss any questions you have with your health care provider. Document Released: 07/04/2007 Document Revised: 06/23/2015 Document Reviewed: 05/19/2013 Elsevier Interactive Patient Education  2017 [employer]vier [employer].      Edwina Barth, MD [facility]

## 2018-02-16 DIAGNOSIS — R221 Localized swelling, mass and lump, neck: Secondary | ICD-10-CM | POA: Diagnosis not present

## 2018-02-16 DIAGNOSIS — R070 Pain in throat: Secondary | ICD-10-CM | POA: Diagnosis not present

## 2018-03-17 DIAGNOSIS — J101 Influenza due to other identified influenza virus with other respiratory manifestations: Secondary | ICD-10-CM | POA: Diagnosis not present

## 2018-03-24 ENCOUNTER — Emergency Department (HOSPITAL_COMMUNITY)
Admission: EM | Admit: 2018-03-24 | Discharge: 2018-03-24 | Disposition: A | Discharge status: Home / Self Care | Payer: 59 | Attending: Emergency Medicine | Admitting: Emergency Medicine

## 2018-03-24 ENCOUNTER — Emergency Department (HOSPITAL_COMMUNITY): Payer: 59

## 2018-03-24 ENCOUNTER — Other Ambulatory Visit: Payer: Self-pay

## 2018-03-24 ENCOUNTER — Encounter (HOSPITAL_COMMUNITY): Payer: Self-pay

## 2018-03-24 DIAGNOSIS — R9431 Abnormal electrocardiogram [ECG] [EKG]: Secondary | ICD-10-CM | POA: Insufficient documentation

## 2018-03-24 DIAGNOSIS — R0602 Shortness of breath: Secondary | ICD-10-CM | POA: Insufficient documentation

## 2018-03-24 LAB — BASIC METABOLIC PANEL
Anion gap: 10 (ref 5–15)
BUN: 14 mg/dL (ref 6–20)
CHLORIDE: 100 mmol/L (ref 98–111)
CO2: 28 mmol/L (ref 22–32)
CREATININE: 1.17 mg/dL (ref 0.61–1.24)
Calcium: 9.2 mg/dL (ref 8.9–10.3)
GFR calc Af Amer: >60 mL/min (ref >60)
GFR calc non Af Amer: >60 mL/min (ref >60)
GLUCOSE: 94 mg/dL (ref 70–99)
POTASSIUM: 3.7 mmol/L (ref 3.5–5.1)
Sodium: 138 mmol/L (ref 135–145)

## 2018-03-24 LAB — CBC
HCT: 48.3 % (ref 39.0–52.0)
Hemoglobin: 15.9 g/dL (ref 13.0–17.0)
MCH: 28.3 pg (ref 26.0–34.0)
MCHC: 32.9 g/dL (ref 30.0–36.0)
MCV: 85.9 fL (ref 80.0–100.0)
NRBC: 0 % (ref 0.0–0.2)
Platelets: 251 10*3/uL (ref 150–400)
RBC: 5.62 MIL/uL (ref 4.22–5.81)
RDW: 11.7 % (ref 11.5–15.5)
WBC: 9 10*3/uL (ref 4.0–10.5)

## 2018-03-24 LAB — I-STAT TROPONIN, ED: TROPONIN I, POC: 0 ng/mL (ref 0.00–0.08)

## 2018-03-24 MED ORDER — SODIUM CHLORIDE 0.9% FLUSH: 3.0000 mL | Freq: Once | INTRAVENOUS | Status: DC

## 2018-03-24 NOTE — ED Notes (Signed)
Lab will add on labs

## 2018-03-24 NOTE — ED Triage Notes (Signed)
Pt here from urgent care for an abnormal ekg.  Pt states no pain at this time.  A&Ox4.

## 2018-03-24 NOTE — Discharge Instructions (Addendum)
We saw in the ER for shortness of breath. As discussed, the x-ray here does not reveal any evidence of congestive heart failure or enlarged heart.  The lab work-up rules out severe anemia or heart attack.  The EKG of your heart did have some subtle changes, for which we are requesting that you follow-up with the cardiology service for further evaluation.  Again our suspicion for blood clot in the lung is low.  However if you start noticing worsening of your shortness of breath, have bloody cough, have near fainting spells or start having chest pain with breathing return to the ER immediately.

## 2018-03-24 NOTE — ED Notes (Signed)
ED Provider at bedside. 

## 2018-03-24 NOTE — ED Notes (Signed)
Called lab and requested that additional labs be cancelled per provider.

## 2018-03-24 NOTE — ED Provider Notes (Signed)
[facility] EMERGENCY DEPARTMENT Provider Note   CSN: [id number] Arrival date & time: 03/24/18  1937    History   Chief Complaint Chief Complaint  Patient presents with  . Abnormal ECG  . Shortness of Breath    HPI Dustin Saunders is a 33 y.o. male.     HPI  33 year old male comes in with chief complaint of shortness of breath and abnormal EKG.  Patient states that he was at work, and while at work he started having acute episode of shortness of breath.  He went on to see the nurse at his work and they found out that his O2 sats was at 85%, therefore he was advised to go to the urgent care.  At the urgent care patient's oxygen saturations were normal, however he was noted to have abnormal EKG and according to the urgent care enlarged heart, therefore he was sent to the ER.  At the moment patient has no shortness of breath.  He denies any chest pain at any point.  Patient states that last night he was having some shortness of breath as well, but it resolved on its own.  He has not noted any specific evoking factor for his shortness of breath, specifically it is not he woke with any exertion. Pt has no hx of PE, DVT and denies any exogenous hormone (testosterone / estrogen) use, long distance travels or surgery in the past 6 weeks, active cancer, recent immobilization.  Patient does indicate that he is had URI-like symptoms over the last 1 week.  There is no known history of anxiety.  Patient also denies any family history of premature CAD or arrhythmia.  History reviewed. No pertinent past medical history.  Patient Active Problem List   Diagnosis Date Noted  . Acute bilateral back pain 06/07/2016    History reviewed. No pertinent surgical history.      Home Medications    Prior to Admission medications   Not on File    Family History Family History  Problem Relation Age of Onset  . Hypertension Mother   . Cancer Father     Social  History Social History   Tobacco Use  . Smoking status: Never Smoker  . Smokeless tobacco: Never Used  Substance Use Topics  . Alcohol use: No  . Drug use: No     Allergies   Patient has no known allergies.   Review of Systems Review of Systems  Constitutional: Positive for activity change.  Respiratory: Positive for shortness of breath.   Cardiovascular: Negative for chest pain.  Neurological: Negative for dizziness, syncope and light-headedness.     Physical Exam Updated Vital Signs BP 136/87 (BP Location: Left Arm)   Pulse 63   Temp 98.3 F (36.8 C) (Oral)   Resp 18   SpO2 97%   Physical Exam Vitals signs and nursing note reviewed.  Constitutional:      Appearance: He is well-developed.  HENT:     Head: Atraumatic.  Neck:     Musculoskeletal: Neck supple.  Cardiovascular:     Rate and Rhythm: Normal rate.  Pulmonary:     Effort: Pulmonary effort is normal.     Breath sounds: No decreased breath sounds, wheezing, rhonchi or rales.  Musculoskeletal:     Right lower leg: No edema.     Left lower leg: No edema.  Skin:    General: Skin is warm.  Neurological:     Mental Status: He is alert and  oriented to person, place, and time.      ED Treatments / Results  Labs (all labs ordered are listed, but only abnormal results are displayed) Labs Reviewed  BASIC METABOLIC PANEL  CBC  I-STAT TROPONIN, ED    EKG EKG Interpretation  Date/Time:  Monday March 24 2018 19:44:21 EST Ventricular Rate:  64 PR Interval:  154 QRS Duration: 90 QT Interval:  388 QTC Calculation: 400 R Axis:   55 Text Interpretation:  Normal sinus rhythm T wave abnormality, consider inferolateral ischemia Abnormal ECG No acute changes TWI in the inferior leads No old tracing to compare Confirmed by Derwood Kaplan [phone]) on 03/24/2018 8:32:56 PM   Radiology Dg Chest 2 View  Result Date: 03/24/2018 CLINICAL DATA:  Abnormal EKG, shortness of breath, and bradycardia. Flu last  week. EXAM: CHEST - 2 VIEW COMPARISON:  06/07/2016 FINDINGS: Normal heart size and pulmonary vascularity. No focal airspace disease or consolidation in the lungs. No blunting of costophrenic angles. No pneumothorax. Mediastinal contours appear intact. IMPRESSION: No active cardiopulmonary disease. Electronically Signed   By: Burman Nieves M.D.   On: 03/24/2018 20:58    Procedures Procedures (including critical care time)  Medications Ordered in ED Medications  sodium chloride flush (NS) 0.9 % injection 3 mL (has no administration in time range)     Initial Impression / Assessment and Plan / ED Course  I have reviewed the triage vital signs and the nursing notes.  Pertinent labs & imaging results that were available during my care of the patient were reviewed by me and considered in my medical decision making (see chart for details).        33 year old male comes in with chief complaint of shortness of breath. Was hypoxic while at work.  At the urgent care or in the ER he is not noted to be hypoxic.  Patient on lung exam does not have any rhonchi, rales or wheezing.  He does not have any known cardiac or lung disease and does not have risk factors for either.  Patient is EKG does have some nonspecific T wave inversions in the inferior leads only.  Given that there is no exertional shortness of breath or precordial chest pain that is consistent with myocardial ischemia, we do not think he is having acute ischemia.  Troponin I ordered at triage is normal.  He does not need any further ischemia work-up, however given the nonspecific T wave changes and him not having PCP, we will have him follow-up with cardiology for probably an echocardiogram.  As far as PE is concerned, patient is PERC negative and his Wells score is 0.  With this we can safely say with 96% accuracy that he does not have a PE.  I discussed these findings with the patient, and initially he wanted to get a d-dimer to be 100%  sure, however later in the stay he declined getting d-dimer.  Patient would prefer coming back to the ER if his symptoms are progressing.  Strict ER return precautions will be discussed for PE.  Patient is safe and stable for discharge with outpatient cardiology follow-up.  Final Clinical Impressions(s) / ED Diagnoses   Final diagnoses:  Abnormal EKG  Shortness of breath    ED Discharge Orders    None       Derwood Kaplan, MD 03/24/18 2135

## 2018-03-24 NOTE — ED Notes (Signed)
Patient transported to X-ray 

## 2018-03-24 NOTE — ED Notes (Signed)
Patient verbalizes understanding of discharge instructions. Opportunity for questioning and answers were provided. Armband removed by staff, pt discharged from ED.  

## 2018-04-11 DIAGNOSIS — R221 Localized swelling, mass and lump, neck: Secondary | ICD-10-CM | POA: Diagnosis not present

## 2018-05-03 IMAGING — DX DG CHEST 2V
2 series · 2 of 2 positions shown · non-contrast
Comparison: 08/04/2013

CLINICAL DATA: Acute bilateral back pain.

EXAM:
CHEST  2 VIEW

[chest pa]
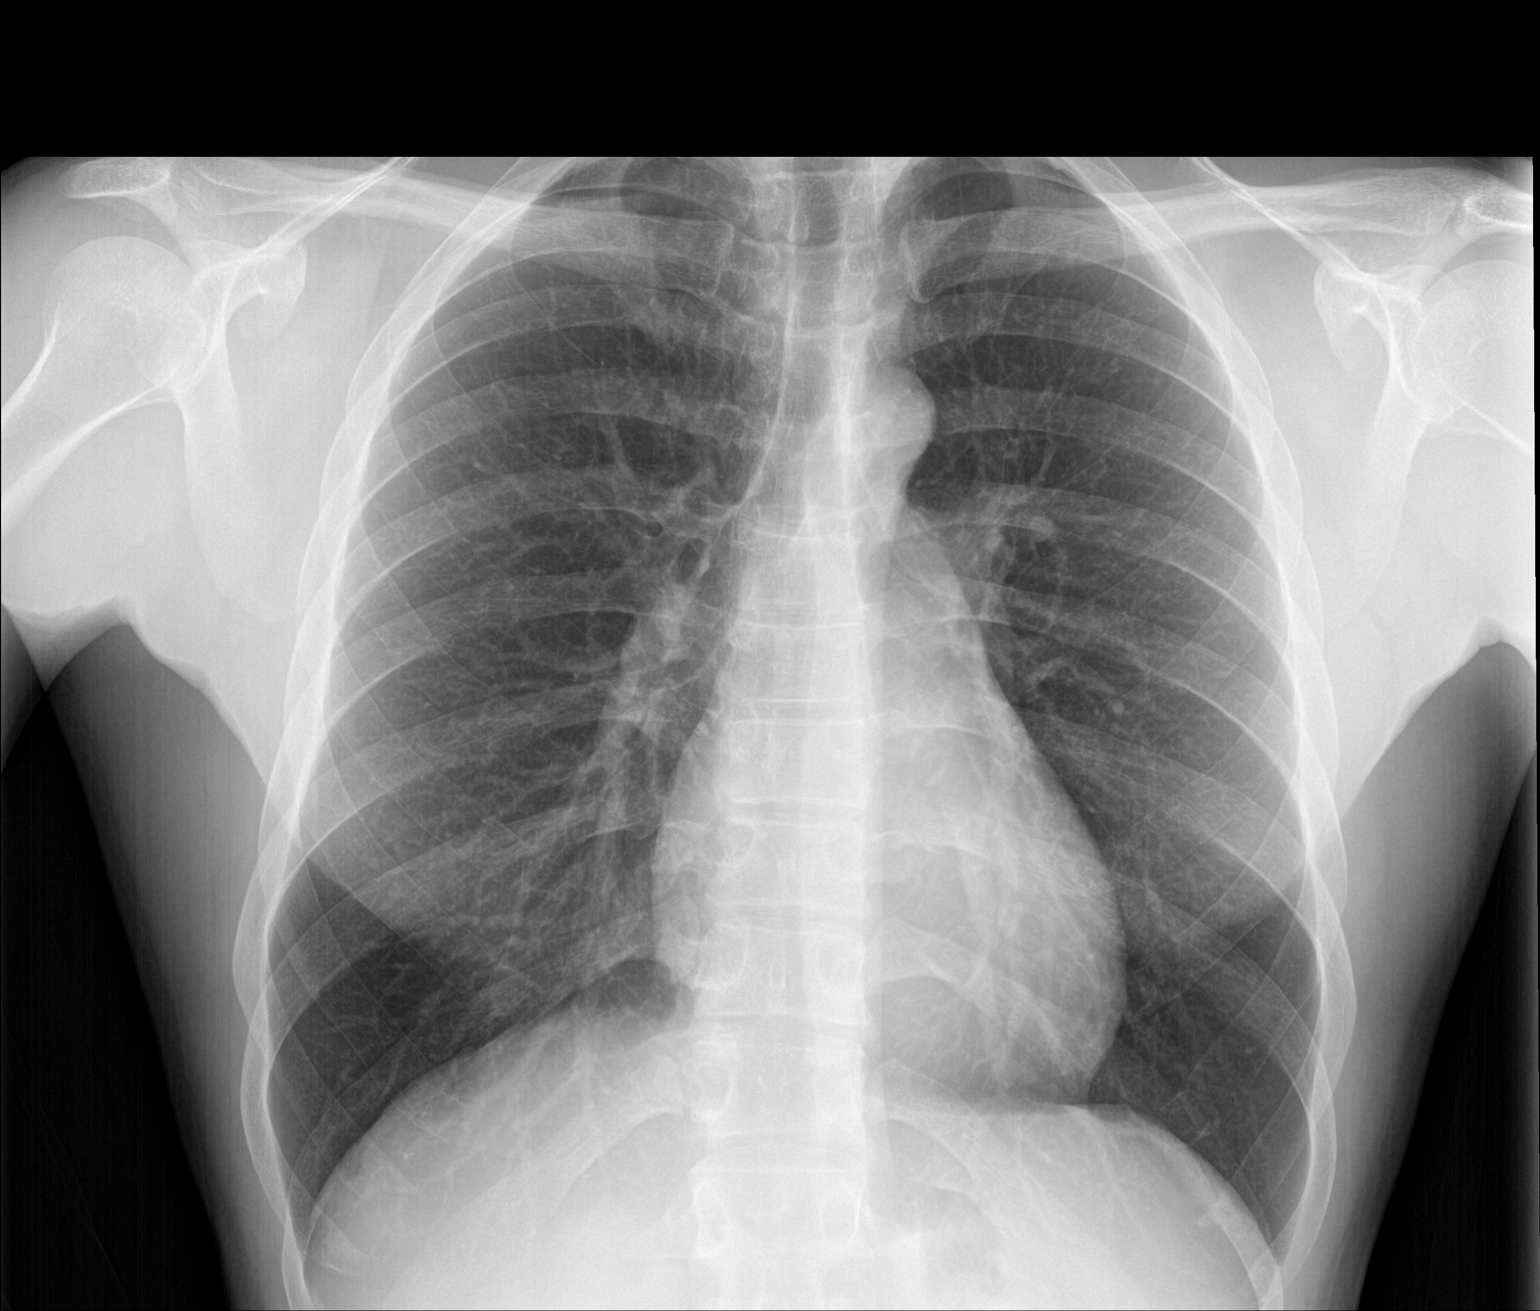

[chest lat]
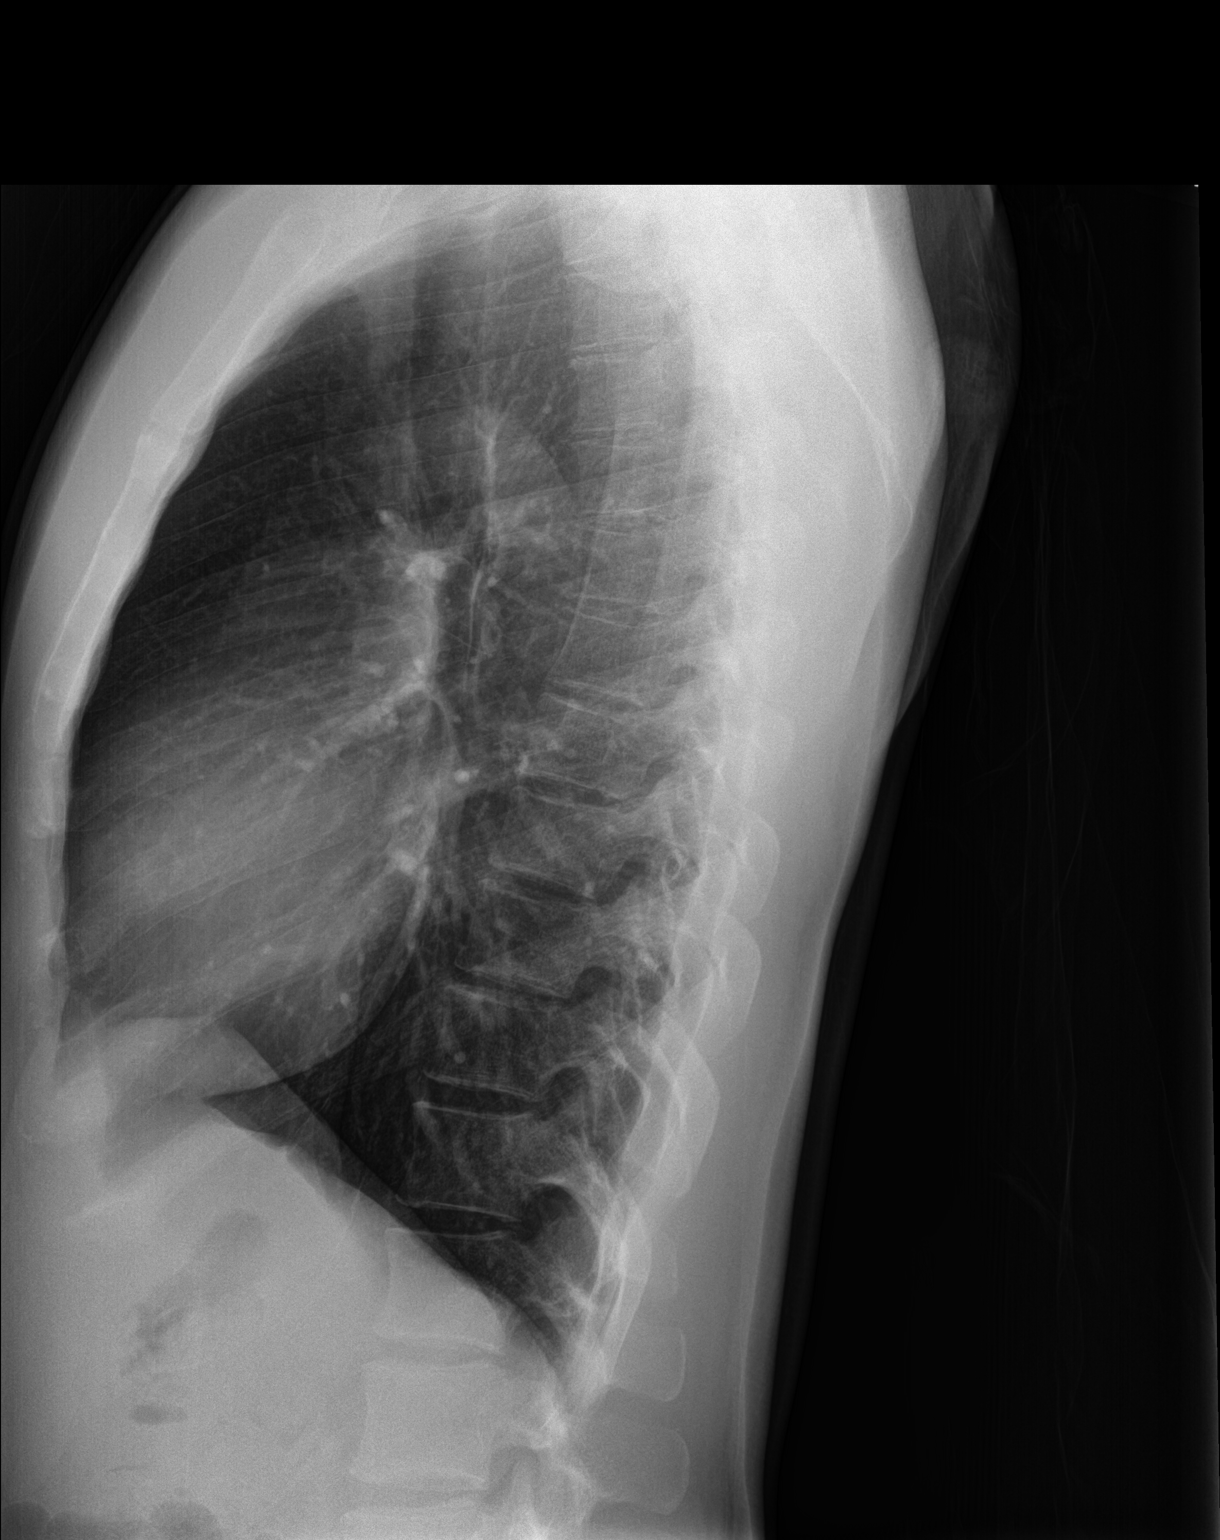

[2 of 2 positions shown; findings below may reference images not displayed]

FINDINGS: Cardiomediastinal silhouette is normal. Mediastinal contours appear
intact.

There is no evidence of focal airspace consolidation, pleural
effusion or pneumothorax.

Osseous structures are without acute abnormality. Soft tissues are
grossly normal.
IMPRESSION: No active cardiopulmonary disease.

## 2020-02-17 IMAGING — CR DG CHEST 2V
2 series · 2 of 2 positions shown · non-contrast
Comparison: 06/07/2016

CLINICAL DATA: Abnormal EKG, shortness of breath, and bradycardia.
Flu last week.

EXAM:
CHEST - 2 VIEW

[chest pa]
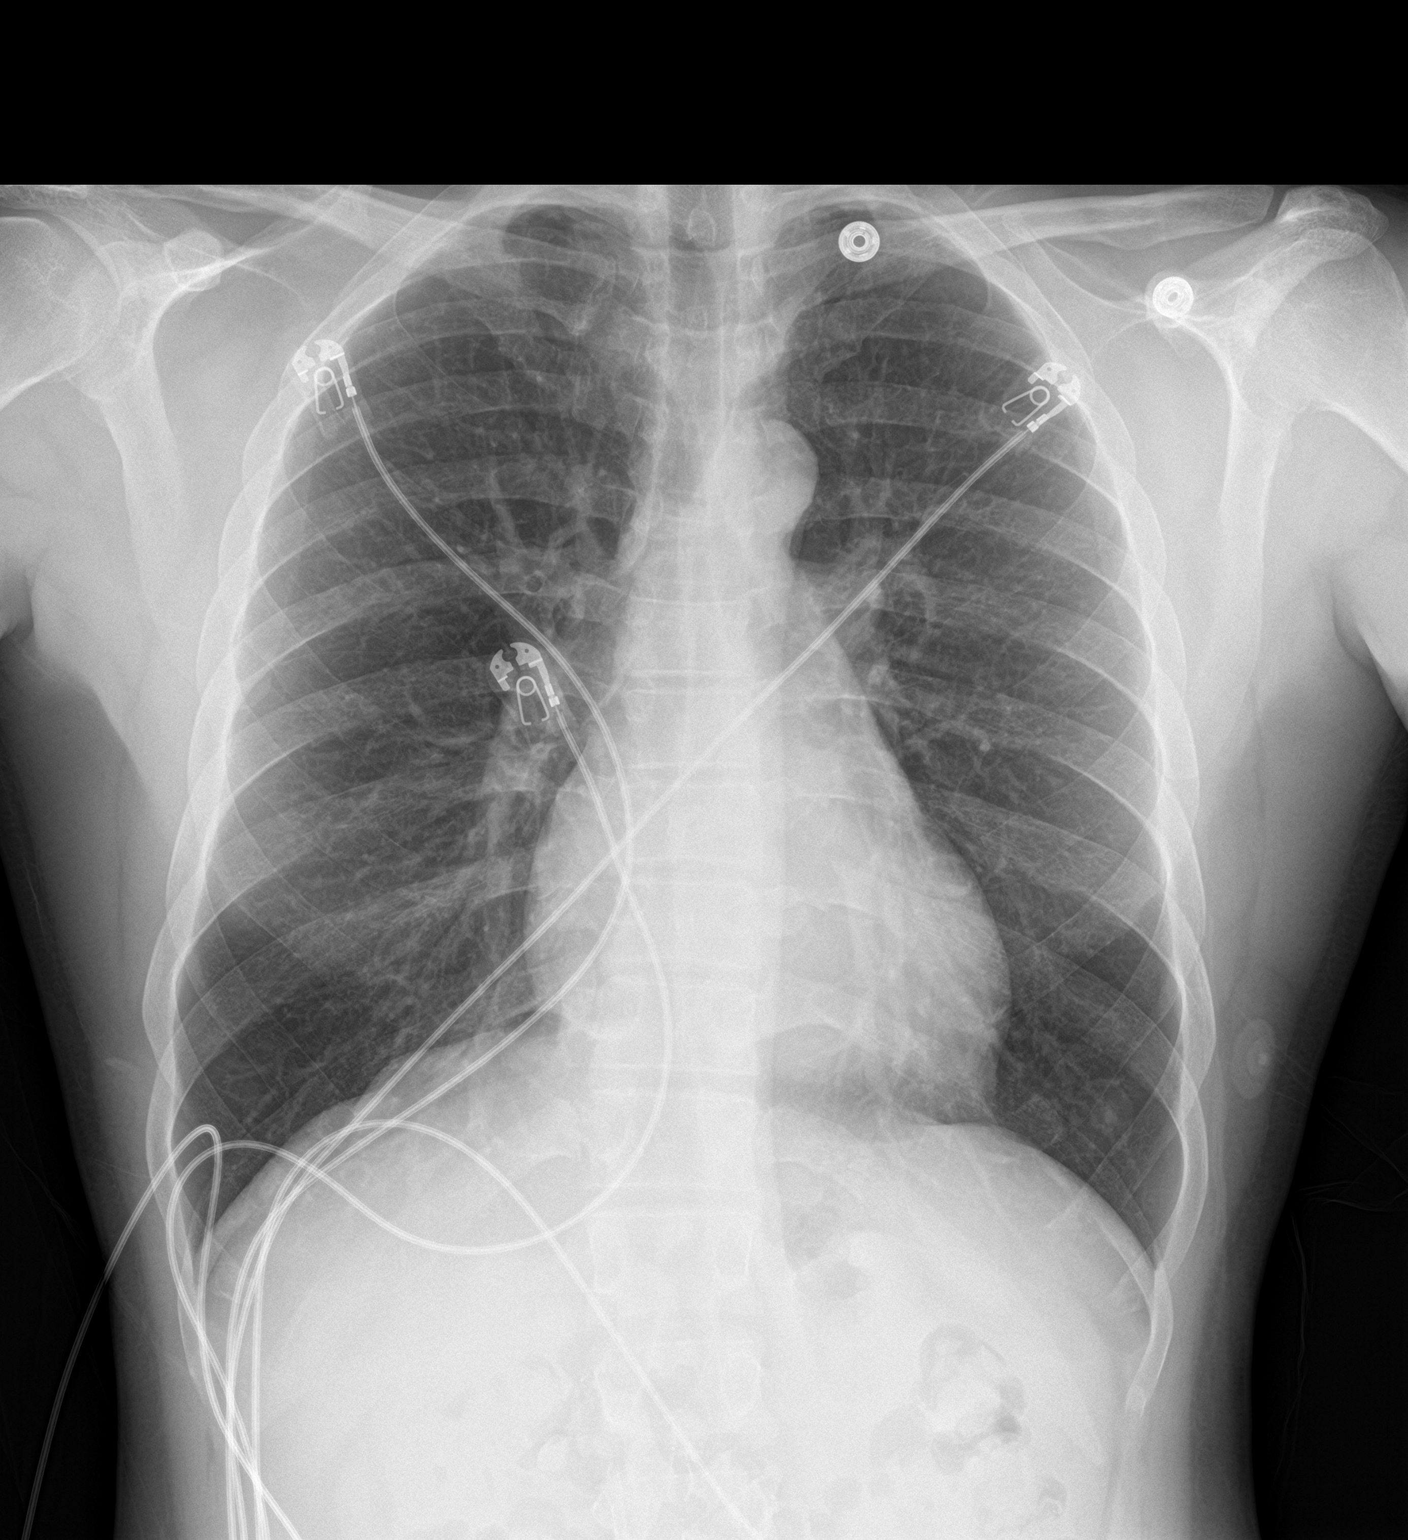

[chest lat]
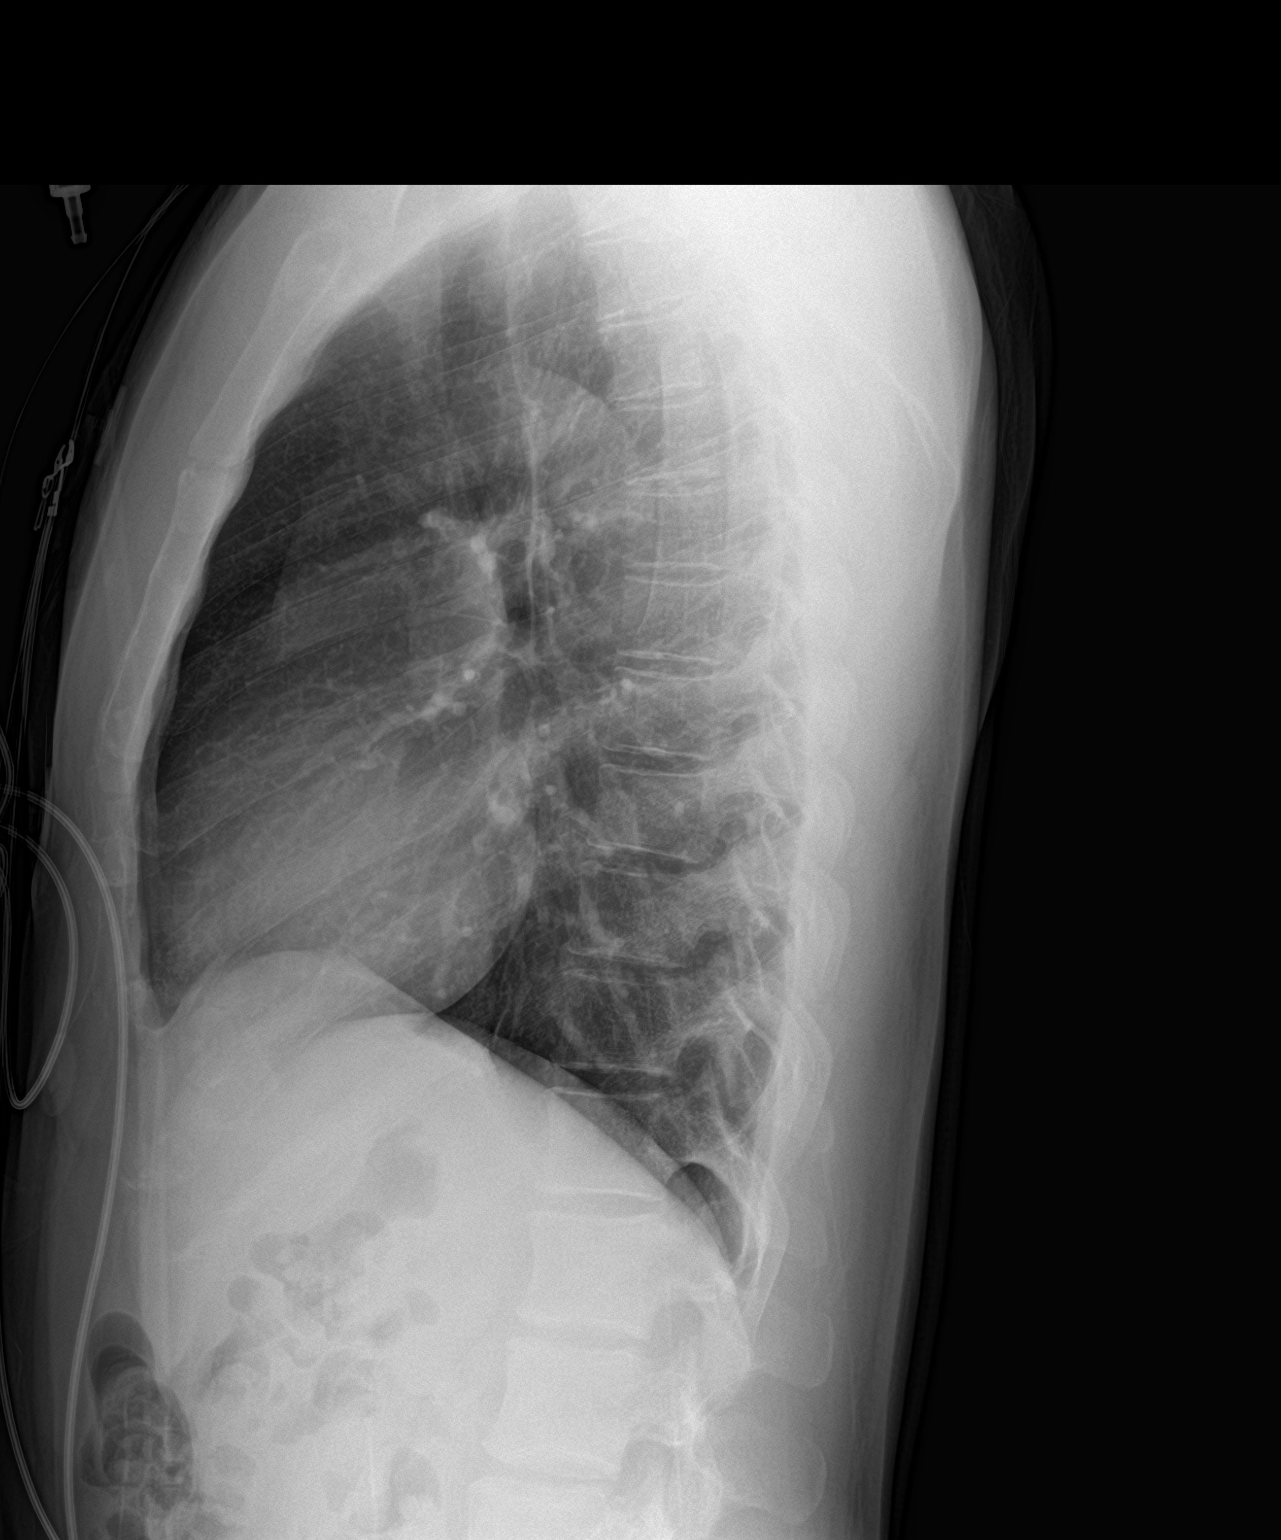

[2 of 2 positions shown; findings below may reference images not displayed]

FINDINGS: Normal heart size and pulmonary vascularity. No focal airspace
disease or consolidation in the lungs. No blunting of costophrenic
angles. No pneumothorax. Mediastinal contours appear intact.
IMPRESSION: No active cardiopulmonary disease.

## 2023-07-23 ENCOUNTER — Ambulatory Visit
Admission: EM | Admit: 2023-07-23 | Discharge: 2023-07-23 | Disposition: A | Discharge status: Home / Self Care | Attending: Physician Assistant | Admitting: Physician Assistant

## 2023-07-23 ENCOUNTER — Other Ambulatory Visit: Payer: Self-pay

## 2023-07-23 ENCOUNTER — Ambulatory Visit (HOSPITAL_BASED_OUTPATIENT_CLINIC_OR_DEPARTMENT_OTHER)
Admission: RE | Admit: 2023-07-23 | Discharge: 2023-07-23 | Disposition: A | Discharge status: Home / Self Care | Source: Ambulatory Visit | Attending: Physician Assistant | Admitting: Physician Assistant

## 2023-07-23 ENCOUNTER — Ambulatory Visit: Payer: Self-pay | Admitting: Physician Assistant

## 2023-07-23 DIAGNOSIS — R079 Chest pain, unspecified: Secondary | ICD-10-CM | POA: Diagnosis not present

## 2023-07-23 DIAGNOSIS — R42 Dizziness and giddiness: Secondary | ICD-10-CM | POA: Insufficient documentation

## 2023-07-23 DIAGNOSIS — R0989 Other specified symptoms and signs involving the circulatory and respiratory systems: Secondary | ICD-10-CM | POA: Insufficient documentation

## 2023-07-23 DIAGNOSIS — R0602 Shortness of breath: Secondary | ICD-10-CM | POA: Diagnosis not present

## 2023-07-23 NOTE — ED Provider Notes (Signed)
 [facility]    CSN: [id number] Arrival date & time: 07/23/23  1626      History   Chief Complaint Chief Complaint  Patient presents with   Chest Congestion    Dizziness    HPI Dustin Saunders is a 38 y.o. male.   HPI  Pt presents today with concerns of chest congestion that has been ongoing for about 2 days He reports a slight dull pain in the left chest area that started today along with lightheadedness  He denies SOB or coughing He reports feeling warm and diaphoretic without cause while sitting in office in [facility] He reports chest pain is not reproducible and does not radiated into jaw or left arm   he denies previous cardiac hx  He reports his brother had a heart attack recently which caused him some concern for his symptoms today   He denies previous hx of smoking or vaping   History reviewed. No pertinent past medical history.  Patient Active Problem List   Diagnosis Date Noted   Acute bilateral back pain 06/07/2016    History reviewed. No pertinent surgical history.     Home Medications    Prior to Admission medications   Not on File    Family History Family History  Problem Relation Age of Onset   Hypertension Mother    Cancer Father     Social History Social History   Tobacco Use   Smoking status: Never   Smokeless tobacco: Never  Vaping Use   Vaping status: Never Used  Substance Use Topics   Alcohol use: No   Drug use: No     Allergies   Patient has no known allergies.   Review of Systems Review of Systems  Respiratory:  Negative for cough, shortness of breath and wheezing.   Cardiovascular:  Positive for chest pain. Negative for palpitations and leg swelling.  Neurological:  Positive for light-headedness.     Physical Exam Triage Vital Signs ED Triage Vitals  Encounter Vitals Group     BP 07/23/23 1641 (!) 144/96     Girls Systolic BP Percentile --      Girls Diastolic BP Percentile --      Boys Systolic  BP Percentile --      Boys Diastolic BP Percentile --      Pulse Rate 07/23/23 1641 (!) 57     Resp 07/23/23 1641 17     Temp 07/23/23 1641 98.8 F (37.1 C)     Temp Source 07/23/23 1641 Oral     SpO2 07/23/23 1641 93 %     Weight 07/23/23 1646 146 lb (66.2 kg)     Height 07/23/23 1646 5' 7 (1.702 m)     Head Circumference --      Peak Flow --      Pain Score 07/23/23 1645 3     Pain Loc --      Pain Education --      Exclude from Growth Chart --    No data found.  Updated Vital Signs BP (!) 144/96 (BP Location: Right Arm) Comment: pt states this is his typical pressure.  Pulse (!) 59   Temp 98.8 F (37.1 C) (Oral)   Resp 17   Ht 5' 7 (1.702 m)   Wt 146 lb (66.2 kg)   SpO2 95%   BMI 22.87 kg/m   Visual Acuity Right Eye Distance:   Left Eye Distance:   Bilateral Distance:    Right  Eye Near:   Left Eye Near:    Bilateral Near:     Physical Exam Vitals reviewed.  Constitutional:      General: He is awake. He is not in acute distress.    Appearance: Normal appearance. He is well-developed and well-groomed. He is not ill-appearing, toxic-appearing or diaphoretic.  HENT:     Head: Normocephalic and atraumatic.   Cardiovascular:     Rate and Rhythm: Normal rate and regular rhythm.     Pulses:          Radial pulses are 2+ on the right side and 2+ on the left side.     Heart sounds: Normal heart sounds. No murmur heard.    No friction rub. No gallop.  Pulmonary:     Effort: Pulmonary effort is normal.     Breath sounds: Normal breath sounds. No decreased air movement. No decreased breath sounds, wheezing, rhonchi or rales.   Musculoskeletal:     Cervical back: Normal range of motion and neck supple.     Right lower leg: No edema.     Left lower leg: No edema.   Neurological:     General: No focal deficit present.     Mental Status: He is alert and oriented to person, place, and time.     Cranial Nerves: No cranial nerve deficit, dysarthria or facial  asymmetry.   Psychiatric:        Behavior: Behavior is cooperative.      UC Treatments / Results  Labs (all labs ordered are listed, but only abnormal results are displayed) Labs Reviewed - No data to display  EKG   Radiology DG Chest 2 View Result Date: 07/23/2023 CLINICAL DATA:  Low O2 sats, chest pain EXAM: CHEST - 2 VIEW COMPARISON:  03/29/2021 FINDINGS: The heart size and mediastinal contours are within normal limits. Both lungs are clear. The visualized skeletal structures are unremarkable. IMPRESSION: No active cardiopulmonary disease. Electronically Signed   By: Franky Crease M.D.   On: 07/23/2023 18:31    Procedures ED EKG  Date/Time: 07/23/2023 5:04 PM  Performed by: Marylene Rocky BRAVO, PA-C Authorized by: Marylene Rocky BRAVO, PA-C   Previous ECG:    Previous ECG:  Compared to current   Similarity:  No change   Comparison ECG info:  03/24/2018 Interpretation:    Interpretation: normal   Rate:    ECG rate:  55   ECG rate assessment: bradycardic   Rhythm:    Rhythm: sinus bradycardia   QRS:    QRS axis:  Normal ST segments:    ST segments:  Normal  (including critical care time)  Medications Ordered in UC Medications - No data to display  Initial Impression / Assessment and Plan / UC Course  I have reviewed the triage vital signs and the nursing notes.  Pertinent labs & imaging results that were available during my care of the patient were reviewed by me and considered in my medical decision making (see chart for details).      Final Clinical Impressions(s) / UC Diagnoses   Final diagnoses:  Chest pain, unspecified type  SOB (shortness of breath)   Patient presents with concern for chest congestion has been ongoing for over the past 2 days.  He also reports new left-sided chest pain that started earlier today while at work which was also accompanied by lightheadedness and diaphoresis.  EKG findings are overall reassuring and appear consistent with EKG from  03/24/2018.  Chest x-ray completed  at [facility] was negative for acute cardiopulmonary illness.  During exam patient did have oxygen saturation of 93 to 95%.  No obvious wheezes, rales, rhonchi or decreased air movement.  I reviewed with patient that while the EKG was reassuring and completely able to rule out cardiac event without appropriate lab work which is done in the emergency room.  Patient voiced agreement and understanding with this information and is content with EKG interpretation at this time.  Given patient presentation I do recommend follow-up with PCP for potential Holter monitor or long-term heart monitor for further evaluation.  Reviewed ED and return precautions with patient which were also provided in the AVS.  Follow-up as needed for progressing or persistent symptoms    Discharge Instructions      You were seen today for concerns of chest discomfort and shortness of breath At this time your EKG is reassuring but your oxygen saturation was a bit low. I am sending you for a chest xray at [facility] for further rule out. We will keep you updated on the xray results once they are available and if there are any changes to your management plan.   [address] [place], [place] [address] Phone:  [phone]  If you have any of the following please go to the ED or call 911 for assistance: severe chest pain, trouble breathing, chest pain with radiation into your jaw or left arm, loss of consciousness.        ED Prescriptions   None    PDMP not reviewed this encounter.   Marylene Rocky BRAVO, PA-C 07/23/23 1932

## 2023-07-23 NOTE — Discharge Instructions (Addendum)
 You were seen today for concerns of chest discomfort and shortness of breath At this time your EKG is reassuring but your oxygen saturation was a bit low. I am sending you for a chest xray at [facility] for further rule out. We will keep you updated on the xray results once they are available and if there are any changes to your management plan.   [address] [place], [place] [address] Phone:  [phone]  If you have any of the following please go to the ED or call 911 for assistance: severe chest pain, trouble breathing, chest pain with radiation into your jaw or left arm, loss of consciousness.

## 2023-07-23 NOTE — Progress Notes (Signed)
 Chest xray normal. No change to management plan. If he continues to have shortness of breath or difficulty breathing, recommend follow up with PCP or ED for more severe symptoms

## 2023-07-23 NOTE — ED Triage Notes (Signed)
 Pt presents with complaints of chest congestion x 2 days. Also states he was at work today and began to feel lightheaded while sitting at his desk. Pt currently rates his pain a 3/10. Pointing to left side of chest. Denies SOB.

## 2023-07-23 NOTE — ED Notes (Signed)
 This RN accidentally captured two EKG readings, both sent to [facility] one minute apart from each other. Only one counted for in charge capture. Erin PA viewed both EKG readings at this time.

## 2023-07-24 ENCOUNTER — Ambulatory Visit
Admission: EM | Admit: 2023-07-24 | Discharge: 2023-07-24 | Disposition: A | Discharge status: Home / Self Care | Attending: Family Medicine | Admitting: Family Medicine

## 2023-07-24 ENCOUNTER — Encounter: Payer: Self-pay | Admitting: Emergency Medicine

## 2023-07-24 DIAGNOSIS — R091 Pleurisy: Secondary | ICD-10-CM | POA: Diagnosis not present

## 2023-07-24 NOTE — Discharge Instructions (Addendum)
  1. Pleuritis (Primary) - EKG 12-Lead completed in UC shows normal sinus rhythm with sinus arrhythmia, ventricular rate of 69 bpm, no STEMI. - Review of chest x-ray performed yesterday in UC reveals no acute cardiopulmonary processes or consolidation consistent with pneumonia. - Modified heart score performed in UC reveals a 0.9 to 1.7% risk of MACE, signifying relatively minimal risk of all cause ACS. - Continue to monitor current symptoms for change in severity if there is any escalation of current symptoms or development of new symptoms follow-up in ER for further evaluation and management.

## 2023-07-24 NOTE — ED Provider Notes (Signed)
 [facility]  Note:  This document was prepared using Dragon voice recognition software and may include unintentional dictation errors.  MRN: [id number] DOB: 04-21-1985  Subjective:   Dustin Saunders is a 38 y.o. male presenting for reevaluation of chest pain from yesterday.  Patient was seen yesterday for slight chest discomfort and chest congestion.  Chest x-ray was negative and EKG was normal.  Patient reports that today he was lifting a car battery in the heat when his chest pain became worse and he started having shortness of breath.  Patient reports that he still has pain in the center of his chest with deep breaths.  Patient reports that pain is about a 7 out of 10.  Patient has no past history of ACS or MI.  Patient reports that chest discomfort with deep breathing is slightly improving but was still concerned symptoms came on suddenly.  No current facility-administered medications for this encounter. No current outpatient medications on file.   No Known Allergies  History reviewed. No pertinent past medical history.   History reviewed. No pertinent surgical history.  Family History  Problem Relation Age of Onset   Hypertension Mother    Cancer Father     Social History   Tobacco Use   Smoking status: Never   Smokeless tobacco: Never  Vaping Use   Vaping status: Never Used  Substance Use Topics   Alcohol use: No   Drug use: No    ROS Refer to HPI for ROS details.  Objective:    Vitals: BP (!) 136/94 (BP Location: Right Arm)   Pulse 68   Temp 98.2 F (36.8 C) (Oral)   Resp 17   SpO2 93%   Physical Exam Vitals and nursing note reviewed.  Constitutional:      General: He is not in acute distress.    Appearance: He is well-developed. He is not ill-appearing or toxic-appearing.  HENT:     Head: Normocephalic.   Cardiovascular:     Rate and Rhythm: Normal rate and regular rhythm.     Heart sounds: Normal heart sounds. No murmur  heard. Pulmonary:     Effort: Pulmonary effort is normal. No respiratory distress.     Breath sounds: Normal breath sounds. No stridor. No wheezing, rhonchi or rales.  Chest:     Chest wall: No tenderness.   Skin:    General: Skin is warm and dry.   Neurological:     General: No focal deficit present.     Mental Status: He is alert and oriented to person, place, and time.   Psychiatric:        Mood and Affect: Mood normal.        Behavior: Behavior normal.     Procedures  No results found for this or any previous visit (from the past 24 hours).  Assessment and Plan :     Discharge Instructions       1. Pleuritis (Primary) - EKG 12-Lead completed in UC shows normal sinus rhythm with sinus arrhythmia, ventricular rate of 69 bpm, no STEMI. - Review of chest x-ray performed yesterday in UC reveals no acute cardiopulmonary processes or consolidation consistent with pneumonia. - Modified heart score performed in UC reveals a 0.9 to 1.7% risk of MACE, signifying relatively minimal risk of all cause ACS. - Continue to monitor current symptoms for change in severity if there is any escalation of current symptoms or development of new symptoms follow-up in ER for further evaluation  and management.     [name] B [name]   [name] B, [place] 07/24/23 1439

## 2023-07-24 NOTE — ED Triage Notes (Addendum)
 Pt states he was seen yesterday for slight chest pain and chest congestion. X-ray yesterday negative. Today went to pick up battery and was out in heat when chest pain became worse. Has some SOB.   States pain is center of chest and occurs when he breaths.
